# Patient Record
Sex: Male | Born: 1990 | Race: Black or African American | Hispanic: No | Marital: Single | Smoking: Never smoker
Health system: Southern US, Community
[De-identification: ages and names within clinical notes are randomized; demographics above are authoritative.]

## PROBLEM LIST (undated history)

## (undated) DIAGNOSIS — B029 Zoster without complications: Secondary | ICD-10-CM

---

## 2008-09-11 ENCOUNTER — Ambulatory Visit: Payer: Self-pay | Admitting: Diagnostic Radiology

## 2008-09-11 ENCOUNTER — Emergency Department (HOSPITAL_BASED_OUTPATIENT_CLINIC_OR_DEPARTMENT_OTHER): Admission: EM | Admit: 2008-09-11 | Discharge: 2008-09-11 | Payer: Self-pay | Admitting: Emergency Medicine

## 2013-09-17 ENCOUNTER — Encounter (HOSPITAL_BASED_OUTPATIENT_CLINIC_OR_DEPARTMENT_OTHER): Payer: Self-pay | Admitting: Emergency Medicine

## 2013-09-17 ENCOUNTER — Emergency Department (HOSPITAL_BASED_OUTPATIENT_CLINIC_OR_DEPARTMENT_OTHER): Payer: Self-pay

## 2013-09-17 ENCOUNTER — Emergency Department (HOSPITAL_BASED_OUTPATIENT_CLINIC_OR_DEPARTMENT_OTHER)
Admission: EM | Admit: 2013-09-17 | Discharge: 2013-09-17 | Disposition: A | Discharge status: Home / Self Care | Payer: Self-pay | Attending: Emergency Medicine | Admitting: Emergency Medicine

## 2013-09-17 DIAGNOSIS — S62306A Unspecified fracture of fifth metacarpal bone, right hand, initial encounter for closed fracture: Secondary | ICD-10-CM

## 2013-09-17 DIAGNOSIS — Y929 Unspecified place or not applicable: Secondary | ICD-10-CM | POA: Insufficient documentation

## 2013-09-17 DIAGNOSIS — Z79899 Other long term (current) drug therapy: Secondary | ICD-10-CM | POA: Insufficient documentation

## 2013-09-17 DIAGNOSIS — IMO0002 Reserved for concepts with insufficient information to code with codable children: Secondary | ICD-10-CM | POA: Insufficient documentation

## 2013-09-17 DIAGNOSIS — Y9389 Activity, other specified: Secondary | ICD-10-CM | POA: Insufficient documentation

## 2013-09-17 DIAGNOSIS — S62309A Unspecified fracture of unspecified metacarpal bone, initial encounter for closed fracture: Secondary | ICD-10-CM | POA: Insufficient documentation

## 2013-09-17 MED ORDER — HYDROCODONE-ACETAMINOPHEN 5-325 MG PO TABS: 1.0000 | ORAL_TABLET | ORAL | Status: DC | PRN
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
## 2013-09-17 NOTE — Discharge Instructions (Signed)
Take Vicodin for severe pain only. No driving or operating heavy machinery while taking vicodin. This medication may cause drowsiness.  Boxer's Fracture You have a break (fracture) of the fifth metacarpal bone. This is commonly called a boxer's fracture. This is the bone in the hand where the little finger attaches. The fracture is in the end of that bone, closest to the little finger. It is usually caused when you hit an object with a clenched fist. Often, the knuckle is pushed down by the impact. Sometimes, the fracture rotates out of position. A boxer's fracture will usually heal within 6 weeks, if it is treated properly and protected from re-injury. Surgery is sometimes needed. A cast, splint, or bulky hand dressing may be used to protect and immobilize a boxer's fracture. Do not remove this device or dressing until your caregiver approves. Keep your hand elevated, and apply ice packs for 15-20 minutes every 2 hours, for the first 2 days. Elevation and ice help reduce swelling and relieve pain. See your caregiver, or an orthopedic specialist, for follow-up care within the next 10 days. This is to make sure your fracture is healing properly. Document Released: 04/12/2005 Document Revised: 07/05/2011 Document Reviewed: 09/30/2006 [facility] Patient Information 2014 [place], [place].
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
## 2013-09-21 NOTE — ED Provider Notes (Signed)
Medical screening examination/treatment/procedure(s) were performed by non-physician practitioner and as supervising physician I was immediately available for consultation/collaboration.   EKG Interpretation None        Jaydrien Wassenaar T Demetric Dunnaway, MD 09/21/13 1552 

## 2013-09-23 ENCOUNTER — Encounter (HOSPITAL_BASED_OUTPATIENT_CLINIC_OR_DEPARTMENT_OTHER): Payer: Self-pay | Admitting: Emergency Medicine

## 2013-09-23 ENCOUNTER — Emergency Department (HOSPITAL_BASED_OUTPATIENT_CLINIC_OR_DEPARTMENT_OTHER)
Admission: EM | Admit: 2013-09-23 | Discharge: 2013-09-23 | Disposition: A | Discharge status: Home / Self Care | Payer: Self-pay | Attending: Emergency Medicine | Admitting: Emergency Medicine

## 2013-09-23 DIAGNOSIS — IMO0001 Reserved for inherently not codable concepts without codable children: Secondary | ICD-10-CM | POA: Insufficient documentation

## 2013-09-23 DIAGNOSIS — S62309A Unspecified fracture of unspecified metacarpal bone, initial encounter for closed fracture: Secondary | ICD-10-CM

## 2013-09-23 NOTE — Discharge Instructions (Signed)
Cast or Splint Care Casts and splints support injured limbs and keep bones from moving while they heal.  HOME CARE  Keep the cast or splint uncovered during the drying period.  A plaster cast can take 24 to 48 hours to dry.  A fiberglass cast will dry in less than 1 hour.  Do not rest the cast on anything harder than a pillow for 24 hours.  Do not put weight on your injured limb. Do not put pressure on the cast. Wait for your doctor's approval.  Keep the cast or splint dry.  Cover the cast or splint with a plastic bag during baths or wet weather.  If you have a cast over your chest and belly (trunk), take sponge baths until the cast is taken off.  If your cast gets wet, dry it with a towel or blow dryer. Use the cool setting on the blow dryer.  Keep your cast or splint clean. Wash a dirty cast with a damp cloth.  Do not put any objects under your cast or splint.  Do not scratch the skin under the cast with an object. If itching is a problem, use a blow dryer on a cool setting over the itchy area.  Do not trim or cut your cast.  Do not take out the padding from inside your cast.  Exercise your joints near the cast as told by your doctor.  Raise (elevate) your injured limb on 1 or 2 pillows for the first 1 to 3 days. GET HELP IF:  Your cast or splint cracks.  Your cast or splint is too tight or too loose.  You itch badly under the cast.  Your cast gets wet or has a soft spot.  You have a bad smell coming from the cast.  You get an object stuck under the cast.  Your skin around the cast becomes red or sore.  You have new or more pain after the cast is put on. GET HELP RIGHT AWAY IF:  You have fluid leaking through the cast.  You cannot move your fingers or toes.  Your fingers or toes turn blue or white or are cool, painful, or puffy (swollen).  You have tingling or lose feeling (numbness) around the injured area.  You have bad pain or pressure under the  cast.  You have trouble breathing or have shortness of breath.  You have chest pain. Document Released: 08/12/2010 Document Revised: 12/13/2012 Document Reviewed: 10/19/2012 [facility] Patient Information 2014 [place]. Metacarpal Fracture   The metacarpal bones are in the middle of the hand, connecting the fingers to the wrist. A metacarpal fracture is a break in one of these bones. It is common for an injury of the hand to break one or more of these bones. A metacarpal fracture of the fifth (little) finger, near the knuckle, is also known as a boxer's fracture. SYMPTOMS   Severe pain at the time of injury.  Pain, tenderness, swelling (especially the back of the hand).  Bruising of the hand within 48 hours.  Visible deformity, if the fracture out of alignment (displaced).  Numbness or paralysis from swelling in the hand, causing pressure on the blood vessels or nerves (uncommon). CAUSES   Direct hit (trauma) to the hand, such as a striking blow with the fist.  Indirect stress to the hand, such as twisting or violent muscle contraction (uncommon). RISK INCREASES WITH:  Contact sports (football, rugby, soccer).  Sports that require hitting (boxing, martial arts).  History of bone or joint disease, including osteoporosis.  Poor hand strength and flexibility. PREVENTION  Maintain proper conditioning:  Hand and finger strength.  Flexibility and endurance.  For contact sports, wear properly fitted and padded protective equipment for the hand.  Learn and use proper technique when hitting, punching, and landing from a fall. PROGNOSIS If treated properly, metacarpal fractures can be expected to heal within 4 to 6 weeks. For severe injuries, surgery may be needed. RELATED COMPLICATIONS   Fracture does not heal (nonunion).  Heals in a poor position, including twisted fingers (malunion).  Chronic pain, stiffness, or swelling of the hand.  Excessive bleeding in the  hand, causing pressure and injury to nerves and blood vessels (rare).  Unstable or arthritic joint, following repeated injury or delayed treatment.  Hindrance of normal hand growth in children.  Infection in open fractures (skin broken over fracture) or at the incision or pin sites, if surgery was performed.  Shortening or injured bones.  Bony bump (spur) or loss of shape of the knuckles. TREATMENT  Treatment will vary, depending on the extent of the injury. First, ice and medicine will help reduce pain and inflammation. For a single metacarpal fracture that is not displaced and does not involve the joint, restraint is usually sufficient for healing to occur. Multiple metacarpal fractures, fractures that are displaced, or fractures involving the joint may require surgery. Surgery often involves placing pins and screws in the bones, to hold them in place. Restraint of the injury follows surgery, to allow for healing. After restraint (with or without surgery), stretching and strengthening exercises may be needed to regain strength and a full range of motion. Exercises may be done at home or with a therapist. Sometimes, depending on the sport and position, a brace or splint may be needed when first returning to sports. MEDICATION   Do not take pain medicine for 7 days before surgery.  Only take over-the-counter or prescription medicines for pain, fever, or discomfort as directed by your caregiver.  Prescription pain medicines are usually prescribed only after surgery. Use only as directed and only as much as you need. COLD THERAPY  Cold treatment (icing) should be applied for 10 to 15 minutes every 2 to 3 hours for inflammation and pain, and immediately after activity that aggravates your symptoms. Use ice packs or an ice massage. SEEK IMMEDIATE MEDICAL CARE IF:   Pain, tenderness, or swelling gets worse even with treatment.  You have pain, numbness, or coldness in the hand.  Blue, gray, or  dark color appears in the fingernails.  Any of the following occur after surgery:  You have an oral temperature above 102 F (38.9 C), not controlled by medicine.  You have increased pain, swelling, redness, drainage of fluids, or bleeding in the affected area.  New, unexplained symptoms develop. (Drugs used in treatment may produce side effects.) Document Released: 04/26/1998 Document Revised: 07/05/2011 Document Reviewed: 07/25/2008 [facility] Patient Information 2014 [place], [place].

## 2013-09-23 NOTE — ED Notes (Signed)
Pt reports broke fifth digit  On right hand seen Monday past at [facility] cast inplace  Pt currently reports having increased hand pain and medication ineffective for relief. Also states needs new referral for orthopedic doctor d/t cost .

## 2013-09-23 NOTE — ED Notes (Signed)
I removed patient splint per PA request, Patient used alcohol wipes to clean skin, itch, etc. PA Sophia approved new splint of same type. I applied an ulna gutter splint using sock material, cotton webril, fiberglass with kerlix, then ace wrap. I secured same with tape and placed patient's arm sling he had with him on arrival.

## 2013-09-24 NOTE — ED Provider Notes (Signed)
Medical screening examination/treatment/procedure(s) were performed by non-physician practitioner and as supervising physician I was immediately available for consultation/collaboration.   EKG Interpretation None       Glynn Octave, MD 09/24/13 [phone]

## 2013-09-24 NOTE — ED Provider Notes (Signed)
CSN: [id number]     Arrival date & time 09/23/13  2103 History   First MD Initiated Contact with Patient 09/23/13 2157     Chief Complaint  Patient presents with  . Hand Pain     (Consider location/radiation/quality/duration/timing/severity/associated sxs/prior Treatment) Patient is a 23 y.o. male presenting with hand pain. The history is provided by the patient. No language interpreter was used.  Hand Pain This is a new problem. The current episode started today. The problem occurs constantly. The problem has been gradually worsening. Associated symptoms include joint swelling and myalgias. Nothing aggravates the symptoms. He has tried nothing for the symptoms. The treatment provided moderate relief.  Pt complains of   History reviewed. No pertinent past medical history. History reviewed. No pertinent past surgical history. History reviewed. No pertinent family history. History  Substance Use Topics  . Smoking status: Never Smoker   . Smokeless tobacco: Not on file  . Alcohol Use: No    Review of Systems  Musculoskeletal: Positive for joint swelling and myalgias.  All other systems reviewed and are negative.     Allergies  Review of patient's allergies indicates no known allergies.  Home Medications   Prior to Admission medications   Medication Sig Start Date End Date Taking? Authorizing Provider  HYDROcodone-acetaminophen (NORCO/VICODIN) 5-325 MG per tablet Take 1-2 tablets by mouth every 4 (four) hours as needed. 09/17/13   Trevor Mace, PA-C   BP 130/83  Pulse 53  Temp(Src) 98.1 F (36.7 C) (Oral)  Resp 20  Ht 5\' 8"  (1.727 m)  Wt 155 lb (70.308 kg)  BMI 23.57 kg/m2  SpO2 100% Physical Exam  Nursing note and vitals reviewed. Constitutional: He appears well-developed and well-nourished.  HENT:  Head: Normocephalic.  Left Ear: External ear normal.  Eyes: Pupils are equal, round, and reactive to light.  Cardiovascular: Normal rate.   Pulmonary/Chest: Effort  normal.  Musculoskeletal: He exhibits tenderness.  Splint removed,  Good pulses,    Neurological: He is alert.  Skin: Skin is warm.  Psychiatric: He has a normal mood and affect.   P ED Course  Procedures (including critical care time) Labs Review Labs Reviewed - No data to display  Imaging Review No results found.   EKG Interpretation None      MDM Pt advised to see Dr, Pearletha Forge for evaluation   Final diagnoses:  Fracture, metacarpal    Splint, ibuprofen    Elson Areas, PA-C 09/24/13 0014

## 2013-09-26 ENCOUNTER — Ambulatory Visit (INDEPENDENT_AMBULATORY_CARE_PROVIDER_SITE_OTHER): Payer: Self-pay | Admitting: Family Medicine

## 2013-09-26 ENCOUNTER — Encounter: Payer: Self-pay | Admitting: Family Medicine

## 2013-09-26 VITALS — BP 134/90 | HR 52 | Ht 68.0 in | Wt 155.0 lb

## 2013-09-26 DIAGNOSIS — S62339A Displaced fracture of neck of unspecified metacarpal bone, initial encounter for closed fracture: Secondary | ICD-10-CM

## 2013-09-26 DIAGNOSIS — S62309A Unspecified fracture of unspecified metacarpal bone, initial encounter for closed fracture: Secondary | ICD-10-CM

## 2013-09-26 MED ORDER — HYDROCODONE-ACETAMINOPHEN 5-325 MG PO TABS: 1.0000 | ORAL_TABLET | Freq: Four times a day (QID) | ORAL | Status: DC | PRN

## 2013-09-26 NOTE — Patient Instructions (Signed)
You have a boxer's fracture of the 5th metacarpal. Wear gutter splint at all times - generally for a full 6 weeks. Follow up with me in 2-3 weeks for reevaluation. Norco as needed for severe pain. Elevate as much as possible as well. No use of this hand at work - must be out of work if nothing available.

## 2013-09-28 ENCOUNTER — Encounter: Payer: Self-pay | Admitting: Family Medicine

## 2013-09-28 DIAGNOSIS — S62339A Displaced fracture of neck of unspecified metacarpal bone, initial encounter for closed fracture: Secondary | ICD-10-CM | POA: Insufficient documentation

## 2013-09-28 NOTE — Assessment & Plan Note (Signed)
placed in ulnar gutter splint.  Anticipate total of 6 weeks to wear regularly.  F/u in 2-3 weeks.  Plan to repeat radiographs at that time.  Norco as needed for pain.  Elevation.  Work note given to not use this hand.
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
## 2013-10-17 ENCOUNTER — Ambulatory Visit: Payer: Self-pay | Admitting: Family Medicine

## 2014-07-03 ENCOUNTER — Encounter (HOSPITAL_BASED_OUTPATIENT_CLINIC_OR_DEPARTMENT_OTHER): Payer: Self-pay | Admitting: *Deleted

## 2014-07-03 ENCOUNTER — Emergency Department (HOSPITAL_BASED_OUTPATIENT_CLINIC_OR_DEPARTMENT_OTHER)
Admission: EM | Admit: 2014-07-03 | Discharge: 2014-07-03 | Disposition: A | Discharge status: Home / Self Care | Payer: Self-pay | Attending: Emergency Medicine | Admitting: Emergency Medicine

## 2014-07-03 DIAGNOSIS — G44019 Episodic cluster headache, not intractable: Secondary | ICD-10-CM | POA: Insufficient documentation

## 2014-07-03 MED ORDER — METOCLOPRAMIDE HCL 10 MG PO TABS: 10.0000 mg | ORAL_TABLET | Freq: Four times a day (QID) | ORAL | Status: DC | PRN

## 2014-07-03 MED ORDER — HYDROCODONE-ACETAMINOPHEN 5-325 MG PO TABS: 1.0000 | ORAL_TABLET | Freq: Four times a day (QID) | ORAL | Status: DC | PRN

## 2014-07-03 NOTE — ED Provider Notes (Signed)
CSN: [id number]     Arrival date & time 07/03/14  0546 History   First MD Initiated Contact with Patient 07/03/14 0600     Chief Complaint  Patient presents with  . Headache     (Consider location/radiation/quality/duration/timing/severity/associated sxs/prior Treatment) HPI  This is a 24 year old male with a four-day history of headaches. The headaches are intermittent and last about 15 minutes at a time. They occur several times a day. There is no specific trigger. He has taken ibuprofen without relief area when they occur the pain is moderate to severe. The pain is localized around the left eye. There is associated blurriness of the left eye and photophobia when the headaches are present. He denies nausea, vomiting or any neurologic changes. He has no history of similar headaches in the past.  History reviewed. No pertinent past medical history. History reviewed. No pertinent past surgical history. No family history on file. History  Substance Use Topics  . Smoking status: Never Smoker   . Smokeless tobacco: Not on file  . Alcohol Use: No    Review of Systems  All other systems reviewed and are negative.   Allergies  Review of patient's allergies indicates no known allergies.  Home Medications   Prior to Admission medications   Medication Sig Start Date End Date Taking? Authorizing Provider  HYDROcodone-acetaminophen (NORCO/VICODIN) 5-325 MG per tablet Take 1 tablet by mouth every 6 (six) hours as needed. 09/26/13   Lenda KelpShane R Hudnall, MD   BP 126/83 mmHg  Pulse 60  Temp(Src) 97.9 F (36.6 C) (Oral)  Resp 16  Ht 5\' 8"  (1.727 m)  Wt 155 lb (70.308 kg)  BMI 23.57 kg/m2  SpO2 99%   Physical Exam  General: Well-developed, well-nourished male in no acute distress; appearance consistent with age of record HENT: normocephalic; atraumatic Eyes: pupils equal, round and reactive to light; extraocular muscles intact Neck: supple Heart: regular rate and rhythm Lungs: clear to  auscultation bilaterally Abdomen: soft; nondistended; nontender; no masses or hepatosplenomegaly; bowel sounds present Extremities: No deformity; full range of motion; pulses normal Neurologic: Awake, alert and oriented; motor function intact in all extremities and symmetric; no facial droop; normal coordination speech; negative Romberg; normal finger to nose Skin: Warm and dry Psychiatric: Normal mood and affect    ED Course  Procedures (including critical care time)   MDM  6:36 AM Headache resolved after treatment with 100% oxygen by nonrebreather.   Paula LibraJohn Laird Runnion, MD 07/03/14 [phone]

## 2014-07-03 NOTE — ED Notes (Signed)
C/o ha off and on since Saturday  Denies n/v,  Left eye blurry at times onset last pm

## 2014-07-03 NOTE — ED Notes (Signed)
Ha onset Saturday evening, denies n/v, denies inj, blurred vision in left eye off and on onset last pm

## 2014-07-03 NOTE — Discharge Instructions (Signed)
Cluster Headache Cluster headaches are recognized by their pattern of deep, intense head pain. They normally occur on one side of your head, but they may "switch sides" in subsequent episodes. Typically, cluster headaches:   Are severe in nature.   Occur repeatedly over weeks to months and are followed by periods of no headaches.   Can last from 15 minutes to 3 hours.   Occur at the same time each day, often at night.   Occur several times a day. CAUSES The exact cause of cluster headaches is not known. Alcohol use may be associated with cluster headaches. SIGNS AND SYMPTOMS   Severe pain that begins in or around your eye or temple.   One-sided head pain.   Feeling sick to your stomach (nauseous).   Sensitivity to light.   Runny nose.   Eye redness, tearing, and nasal stuffiness on the side of your head where you are experiencing pain.   Sweaty, pale skin of the face.   Droopy or swollen eyelid.   Restlessness. DIAGNOSIS  Cluster headaches are diagnosed based on symptoms and a physical exam. Your health care provider may order a CT scan or an MRI of your head or lab tests to see if your headaches are caused by other medical conditions.  TREATMENT   Medicines for pain relief and to prevent recurrent attacks. Some people may need a combination of medicines.  Oxygen for pain relief.   Biofeedback programs to help reduce headache pain.  It may be helpful to keep a headache diary. This may help you find a trend for what is triggering your headaches. Your health care provider can develop a treatment plan.  HOME CARE INSTRUCTIONS  During cluster periods:   Follow a regular sleep schedule. Do not vary the amount and time that you sleep from day to day. It is important to stay on the same schedule during a cluster period to help prevent headaches.   Avoid alcohol.   Stop smoking if you smoke.  SEEK MEDICAL CARE IF:  You have any changes from your previous  cluster headaches either in intensity or frequency.   You are not getting relief from medicines you are taking.  SEEK IMMEDIATE MEDICAL CARE IF:   You faint.   You have weakness or numbness, especially on one side of your body or face.   You have double vision.   You have nausea or vomiting that is not relieved within several hours.   You cannot keep your balance or have difficulty talking or walking.   You have neck pain or stiffness.   You have a fever. MAKE SURE YOU:  Understand these instructions.   Will watch your condition.   Will get help right away if you are not doing well or get worse. Document Released: 04/12/2005 Document Revised: 01/31/2013 Document Reviewed: 11/02/2012 [employer] Patient Information 2015 [employer]. This information is not intended to replace advice given to you by your health care provider. Make sure you discuss any questions you have with your health care provider.  

## 2014-07-05 ENCOUNTER — Encounter (HOSPITAL_BASED_OUTPATIENT_CLINIC_OR_DEPARTMENT_OTHER): Payer: Self-pay | Admitting: *Deleted

## 2014-07-05 ENCOUNTER — Emergency Department (HOSPITAL_BASED_OUTPATIENT_CLINIC_OR_DEPARTMENT_OTHER)
Admission: EM | Admit: 2014-07-05 | Discharge: 2014-07-05 | Disposition: A | Discharge status: Home / Self Care | Payer: Self-pay | Attending: Emergency Medicine | Admitting: Emergency Medicine

## 2014-07-05 DIAGNOSIS — B029 Zoster without complications: Secondary | ICD-10-CM | POA: Insufficient documentation

## 2014-07-05 MED ORDER — FLUORESCEIN SODIUM 1 MG OP STRP
2.0000 | ORAL_STRIP | Freq: Once | OPHTHALMIC | Status: AC
  Administered 2014-07-05: 2 via OPHTHALMIC

## 2014-07-05 MED ORDER — TETRACAINE HCL 0.5 % OP SOLN
2.0000 [drp] | Freq: Once | OPHTHALMIC | Status: AC
  Administered 2014-07-05: 2 [drp] via OPHTHALMIC

## 2014-07-05 MED ORDER — ACYCLOVIR 400 MG PO TABS: 400.0000 mg | ORAL_TABLET | Freq: Four times a day (QID) | ORAL | Status: DC

## 2014-07-05 MED ORDER — TETRACAINE HCL 0.5 % OP SOLN
OPHTHALMIC | Status: AC
Start: 2014-07-05 — End: 2014-07-05
  Filled 2014-07-05: qty 2

## 2014-07-05 MED ORDER — OXYCODONE-ACETAMINOPHEN 5-325 MG PO TABS: 1.0000 | ORAL_TABLET | ORAL | Status: DC | PRN

## 2014-07-05 MED ORDER — FLUORESCEIN SODIUM 1 MG OP STRP
ORAL_STRIP | OPHTHALMIC | Status: AC
  Filled 2014-07-05: qty 2

## 2014-07-05 NOTE — ED Provider Notes (Signed)
CSN: [id number]     Arrival date & time 07/05/14  [id number] History   First MD Initiated Contact with Patient 07/05/14 1013     Chief Complaint  Patient presents with  . Rash     (Consider location/radiation/quality/duration/timing/severity/associated sxs/prior Treatment) HPI  Joshua Joseph 24-year-old male comes today stating he has had a left-sided headache for approximately one week. 2 days ago he began having a rash in the left forehead area. The rash is also on his nose. He had any fever. He continues to have pain. He was seen here 2 days ago for similar symptoms. He did not have a rash at that time. Some pain associated with the left ear. He has no change in hearing. He has no eye pain or visual changes.  History reviewed. No pertinent past medical history. History reviewed. No pertinent past surgical history. No family history on file. History  Substance Use Topics  . Smoking status: Never Smoker   . Smokeless tobacco: Not on file  . Alcohol Use: No    Review of Systems  All other systems reviewed and are negative.     Allergies  Review of patient's allergies indicates no known allergies.  Home Medications   Prior to Admission medications   Medication Sig Start Date End Date Taking? Authorizing Provider  HYDROcodone-acetaminophen (NORCO/VICODIN) 5-325 MG per tablet Take 1-2 tablets by mouth every 6 (six) hours as needed (for pain). 07/03/14   John Molpus, MD  metoCLOPramide (REGLAN) 10 MG tablet Take 1 tablet (10 mg total) by mouth every 6 (six) hours as needed (for nausea or headache). 07/03/14   John Molpus, MD   BP 120/75 mmHg  Pulse 60  Temp(Src) 98.6 F (37 C) (Oral)  Resp 16  Ht 5\' 8"  (1.727 m)  Wt 155 lb (70.308 kg)  BMI 23.57 kg/m2  SpO2 98% Physical Exam  Constitutional: He is oriented to person, place, and time. He appears well-developed and well-nourished.  HENT:  Head: Atraumatic.    Right Ear: External ear normal.  Left TM with some serous charge behind it. No  lesions are noted of the ear canal or tympanic membrane.  Vesicular rash on left forehead does not cross midline  Eyes: Conjunctivae are normal. Pupils are equal, round, and reactive to light.  Tetracaine and forcing applied and cornea examined with no evidence of lesions noted.  Neck: Normal range of motion. Neck supple.  Cardiovascular: Normal rate, regular rhythm and normal heart sounds.   Pulmonary/Chest: Effort normal.  Abdominal: Soft. Bowel sounds are normal.  Musculoskeletal: Normal range of motion.  Neurological: He is alert and oriented to person, place, and time.  Skin: Skin is dry. Rash noted.  Psychiatric: He has a normal mood and affect. His behavior is normal. Judgment and thought content normal.  Nursing note and vitals reviewed.   ED Course  Procedures (including critical care time) Labs Review Labs Reviewed - No data to display  Imaging Review No results found.   EKG Interpretation None      MDM   Final diagnoses:  Herpes zoster    24 y.o. male presents with herpes zoster left facial distribution. No eye or ear involvement is noted on exam. Patient is placed on acyclovir. I have discussed return precautions with the patient including eye pain and need for follow-up. Patient voices understanding.    Margarita Grizzleanielle Zenaya Ulatowski, MD 07/05/14 1131

## 2014-07-05 NOTE — Discharge Instructions (Signed)

## 2014-07-05 NOTE — ED Notes (Addendum)
Pt c/o headache x1 week. Was seen here 2 days ago but HA intermittently persists. Pt more concerned with c/o new rash on forehead.

## 2014-07-08 ENCOUNTER — Encounter (HOSPITAL_BASED_OUTPATIENT_CLINIC_OR_DEPARTMENT_OTHER): Payer: Self-pay

## 2014-07-08 ENCOUNTER — Emergency Department (HOSPITAL_BASED_OUTPATIENT_CLINIC_OR_DEPARTMENT_OTHER)
Admission: EM | Admit: 2014-07-08 | Discharge: 2014-07-08 | Disposition: A | Discharge status: Home / Self Care | Payer: Self-pay | Attending: Emergency Medicine | Admitting: Emergency Medicine

## 2014-07-08 DIAGNOSIS — B029 Zoster without complications: Secondary | ICD-10-CM | POA: Insufficient documentation

## 2014-07-08 DIAGNOSIS — K2970 Gastritis, unspecified, without bleeding: Secondary | ICD-10-CM | POA: Insufficient documentation

## 2014-07-08 DIAGNOSIS — Z79899 Other long term (current) drug therapy: Secondary | ICD-10-CM | POA: Insufficient documentation

## 2014-07-08 DIAGNOSIS — R51 Headache: Secondary | ICD-10-CM | POA: Insufficient documentation

## 2014-07-08 HISTORY — DX: Zoster without complications: B02.9

## 2014-07-08 MED ORDER — PANTOPRAZOLE SODIUM 40 MG PO TBEC
40.0000 mg | DELAYED_RELEASE_TABLET | Freq: Once | ORAL | Status: AC
  Administered 2014-07-08: 40 mg via ORAL
  Filled 2014-07-08: qty 1

## 2014-07-08 MED ORDER — HYDROCODONE-ACETAMINOPHEN 5-325 MG PO TABS
2.0000 | ORAL_TABLET | Freq: Four times a day (QID) | ORAL | Status: DC | PRN
Start: 2014-07-08 — End: 2015-05-02

## 2014-07-08 MED ORDER — GI COCKTAIL ~~LOC~~
30.0000 mL | Freq: Once | ORAL | Status: AC
  Administered 2014-07-08: 30 mL via ORAL
  Filled 2014-07-08: qty 30

## 2014-07-08 MED ORDER — OMEPRAZOLE 20 MG PO CPDR: 20.0000 mg | DELAYED_RELEASE_CAPSULE | Freq: Two times a day (BID) | ORAL | Status: DC

## 2014-07-08 NOTE — Discharge Instructions (Signed)
Stop ibuprofen Take acyclovir as directed, take it with food. Her pain take either Tylenol, or the prescription Vicodin, but not both. Vital a second twice a day until your stomach improves, then once per day until you've completed all medications.  Gastritis, Adult Gastritis is soreness and swelling (inflammation) of the lining of the stomach. Gastritis can develop as a sudden onset (acute) or long-term (chronic) condition. If gastritis is not treated, it can lead to stomach bleeding and ulcers. CAUSES  Gastritis occurs when the stomach lining is weak or damaged. Digestive juices from the stomach then inflame the weakened stomach lining. The stomach lining may be weak or damaged due to viral or bacterial infections. One common bacterial infection is the Helicobacter pylori infection. Gastritis can also result from excessive alcohol consumption, taking certain medicines, or having too much acid in the stomach.  SYMPTOMS  In some cases, there are no symptoms. When symptoms are present, they may include:  Pain or a burning sensation in the upper abdomen.  Nausea.  Vomiting.  An uncomfortable feeling of fullness after eating. DIAGNOSIS  Your caregiver may suspect you have gastritis based on your symptoms and a physical exam. To determine the cause of your gastritis, your caregiver may perform the following:  Blood or stool tests to check for the H pylori bacterium.  Gastroscopy. A thin, flexible tube (endoscope) is passed down the esophagus and into the stomach. The endoscope has a light and camera on the end. Your caregiver uses the endoscope to view the inside of the stomach.  Taking a tissue sample (biopsy) from the stomach to examine under a microscope. TREATMENT  Depending on the cause of your gastritis, medicines may be prescribed. If you have a bacterial infection, such as an H pylori infection, antibiotics may be given. If your gastritis is caused by too much acid in the stomach, H2  blockers or antacids may be given. Your caregiver may recommend that you stop taking aspirin, ibuprofen, or other nonsteroidal anti-inflammatory drugs (NSAIDs). HOME CARE INSTRUCTIONS  Only take over-the-counter or prescription medicines as directed by your caregiver.  If you were given antibiotic medicines, take them as directed. Finish them even if you start to feel better.  Drink enough fluids to keep your urine clear or pale yellow.  Avoid foods and drinks that make your symptoms worse, such as:  Caffeine or alcoholic drinks.  Chocolate.  Peppermint or mint flavorings.  Garlic and onions.  Spicy foods.  Citrus fruits, such as oranges, lemons, or limes.  Tomato-based foods such as sauce, chili, salsa, and pizza.  Fried and fatty foods.  Eat small, frequent meals instead of large meals. SEEK IMMEDIATE MEDICAL CARE IF:   You have black or dark red stools.  You vomit blood or material that looks like coffee grounds.  You are unable to keep fluids down.  Your abdominal pain gets worse.  You have a fever.  You do not feel better after 1 week.  You have any other questions or concerns. MAKE SURE YOU:  Understand these instructions.  Will watch your condition.  Will get help right away if you are not doing well or get worse. Document Released: 04/06/2001 Document Revised: 10/12/2011 Document Reviewed: 05/26/2011 [facility] Patient Information 2015 [place], [place]. This information is not intended to replace advice given to you by your health care provider. Make sure you discuss any questions you have with your health care provider.  Shingles Shingles (herpes zoster) is an infection that is caused by the  same virus that causes chickenpox (varicella). The infection causes a painful skin rash and fluid-filled blisters, which eventually break open, crust over, and heal. It may occur in any area of the body, but it usually affects only one side of the body or face. The pain  of shingles usually lasts about 1 month. However, some people with shingles may develop long-term (chronic) pain in the affected area of the body. Shingles often occurs many years after the person had chickenpox. It is more common:  In people older than 50 years.  In people with weakened immune systems, such as those with HIV, AIDS, or cancer.  In people taking medicines that weaken the immune system, such as transplant medicines.  In people under great stress. CAUSES  Shingles is caused by the varicella zoster virus (VZV), which also causes chickenpox. After a person is infected with the virus, it can remain in the person's body for years in an inactive state (dormant). To cause shingles, the virus reactivates and breaks out as an infection in a nerve root. The virus can be spread from person to person (contagious) through contact with open blisters of the shingles rash. It will only spread to people who have not had chickenpox. When these people are exposed to the virus, they may develop chickenpox. They will not develop shingles. Once the blisters scab over, the person is no longer contagious and cannot spread the virus to others. SIGNS AND SYMPTOMS  Shingles shows up in stages. The initial symptoms may be pain, itching, and tingling in an area of the skin. This pain is usually described as burning, stabbing, or throbbing.In a few days or weeks, a painful red rash will appear in the area where the pain, itching, and tingling were felt. The rash is usually on one side of the body in a band or belt-like pattern. Then, the rash usually turns into fluid-filled blisters. They will scab over and dry up in approximately 2-3 weeks. Flu-like symptoms may also occur with the initial symptoms, the rash, or the blisters. These may include:  Fever.  Chills.  Headache.  Upset stomach. DIAGNOSIS  Your health care provider will perform a skin exam to diagnose shingles. Skin scrapings or fluid samples  may also be taken from the blisters. This sample will be examined under a microscope or sent to a lab for further testing. TREATMENT  There is no specific cure for shingles. Your health care provider will likely prescribe medicines to help you manage the pain, recover faster, and avoid long-term problems. This may include antiviral drugs, anti-inflammatory drugs, and pain medicines. HOME CARE INSTRUCTIONS   Take a cool bath or apply cool compresses to the area of the rash or blisters as directed. This may help with the pain and itching.   Take medicines only as directed by your health care provider.   Rest as directed by your health care provider.  Keep your rash and blisters clean with mild soap and cool water or as directed by your health care provider.  Do not pick your blisters or scratch your rash. Apply an anti-itch cream or numbing creams to the affected area as directed by your health care provider.  Keep your shingles rash covered with a loose bandage (dressing).  Avoid skin contact with:  Babies.   Pregnant women.   Children with eczema.   Elderly people with transplants.   People with chronic illnesses, such as leukemia or AIDS.   Wear loose-fitting clothing to help ease the  pain of material rubbing against the rash.  Keep all follow-up visits as directed by your health care provider.If the area involved is on your face, you may receive a referral for a specialist, such as an eye doctor (ophthalmologist) or an ear, nose, and throat (ENT) doctor. Keeping all follow-up visits will help you avoid eye problems, chronic pain, or disability.  SEEK IMMEDIATE MEDICAL CARE IF:   You have facial pain, pain around the eye area, or loss of feeling on one side of your face.  You have ear pain or ringing in your ear.  You have loss of taste.  Your pain is not relieved with prescribed medicines.   Your redness or swelling spreads.   You have more pain and  swelling.  Your condition is worsening or has changed.   You have a fever. MAKE SURE YOU:  Understand these instructions.  Will watch your condition.  Will get help right away if you are not doing well or get worse. Document Released: 04/12/2005 Document Revised: 08/27/2013 Document Reviewed: 11/25/2011 [facility] Patient Information 2015 [place], [place]. This information is not intended to replace advice given to you by your health care provider. Make sure you discuss any questions you have with your health care provider.

## 2014-07-08 NOTE — ED Provider Notes (Signed)
CSN: [id number]     Arrival date & time 07/08/14  0751 History   First MD Initiated Contact with Patient 07/08/14 0758     No chief complaint on file.     HPI  Patient presents for evaluation of abdominal pain. Seen twice several days ago. Had a left-sided headache. Seen 2 days later as he developed a classic herpes zoster rash. Has lesions on his nose. Had normal staining of his eye. Discharged on acyclovir. Has been taking ibuprofen 2-3 capsules every 4 hours when he takes his Vicodin. Has developed "upset stomach" states she has epigastric discomfort in his abdomen. Seems sometimes better sometimes worse with taking by mouth. No nausea vomiting or diarrhea. No black or bloody stools. No other symptoms. No history of abdominal syndromes. No history of biliary colic, hepatitis, pancreatitis. No history of peptic ulcer disease or gastritis.  Past Medical History  Diagnosis Date  . Shingles    History reviewed. No pertinent past surgical history. History reviewed. No pertinent family history. History  Substance Use Topics  . Smoking status: Never Smoker   . Smokeless tobacco: Not on file  . Alcohol Use: No    Review of Systems  Constitutional: Negative for fever, chills, diaphoresis, appetite change and fatigue.  HENT: Negative for mouth sores, sore throat and trouble swallowing.   Eyes: Negative for visual disturbance.  Respiratory: Negative for cough, chest tightness, shortness of breath and wheezing.   Cardiovascular: Negative for chest pain.  Gastrointestinal: Positive for abdominal pain. Negative for nausea, vomiting, diarrhea and abdominal distention.  Endocrine: Negative for polydipsia, polyphagia and polyuria.  Genitourinary: Negative for dysuria, frequency and hematuria.  Musculoskeletal: Negative for gait problem.  Skin: Positive for rash. Negative for color change and pallor.  Neurological: Positive for headaches. Negative for dizziness, syncope and light-headedness.    Hematological: Does not bruise/bleed easily.  Psychiatric/Behavioral: Negative for behavioral problems and confusion.      Allergies  Review of patient's allergies indicates no known allergies.  Home Medications   Prior to Admission medications   Medication Sig Start Date End Date Taking? Authorizing Provider  acyclovir (ZOVIRAX) 400 MG tablet Take 1 tablet (400 mg total) by mouth 4 (four) times daily. 07/05/14   Margarita Grizzle, MD  HYDROcodone-acetaminophen (NORCO/VICODIN) 5-325 MG per tablet Take 2 tablets by mouth every 6 (six) hours as needed for moderate pain. 07/08/14   Rolland Porter, MD  metoCLOPramide (REGLAN) 10 MG tablet Take 1 tablet (10 mg total) by mouth every 6 (six) hours as needed (for nausea or headache). 07/03/14   John Molpus, MD  omeprazole (PRILOSEC) 20 MG capsule Take 1 capsule (20 mg total) by mouth 2 (two) times daily. 07/08/14   Rolland Porter, MD  oxyCODONE-acetaminophen (PERCOCET/ROXICET) 5-325 MG per tablet Take 1 tablet by mouth every 4 (four) hours as needed for severe pain. 07/05/14   Margarita Grizzle, MD   BP 142/89 mmHg  Pulse 58  Temp(Src) 97.7 F (36.5 C) (Oral)  Resp 16  Ht  (1.727 m)  Wt 155 lb (70.308 kg)  BMI 23.57 kg/m2  SpO2 100% Physical Exam  Constitutional: He is oriented to person, place, and time. He appears well-developed and well-nourished. No distress.  HENT:  Head: Normocephalic.    Normal facial strength. Symmetric, without droop. No eye pain. No conjunctivitis. No lesions on the TMs.  ie  clinically no signs of ophthalmicus, or [place].  Eyes: Conjunctivae are normal. Pupils are equal, round, and reactive to light. No scleral  icterus.  Neck: Normal range of motion. Neck supple. No thyromegaly present.  Cardiovascular: Normal rate and regular rhythm.  Exam reveals no gallop and no friction rub.   No murmur heard. Pulmonary/Chest: Effort normal and breath sounds normal. No respiratory distress. He has no wheezes. He has no rales.   Abdominal: Soft. Bowel sounds are normal. He exhibits no distension. There is no tenderness. There is no rebound.    Musculoskeletal: Normal range of motion.  Neurological: He is alert and oriented to person, place, and time.  Skin: Skin is warm and dry. No rash noted.  Psychiatric: He has a normal mood and affect. His behavior is normal.    ED Course  Procedures (including critical care time) Labs Review Labs Reviewed - No data to display  Imaging Review No results found.   EKG Interpretation None      MDM   Final diagnoses:  Gastritis  Herpes zoster    Symptoms seem related to his use of Motrin. He is a nonsmoker. Does not drink. It lasted avoid all anti-inflammatory medications. Use either Tylenol, or the prescription Vicodin, but not both. Prilosec twice a day until improving, then daily until completion of all medications. Given return precautions. Asked to recheck if he develops eye pain, or facial weakness/drooping.    Rolland PorterMark Jayleen Scaglione, MD 07/08/14 [id number]

## 2014-07-08 NOTE — ED Notes (Signed)
Pt was seen here previously for HA and diagnosed with shingles.  Pt has dried rash to left upper forehead.  Pt reports that he has had a "stomach ache" for two days.  Pt denies any n/v/d and sts "I don't know if its from the medicine or what."

## 2015-02-22 IMAGING — CR DG HAND COMPLETE 3+V*R*
3 series · 3 of 3 positions shown · non-contrast
Comparison: None.

CLINICAL DATA: Hand injury

EXAM:
RIGHT HAND - COMPLETE 3+ VIEW

[x hand pa right]
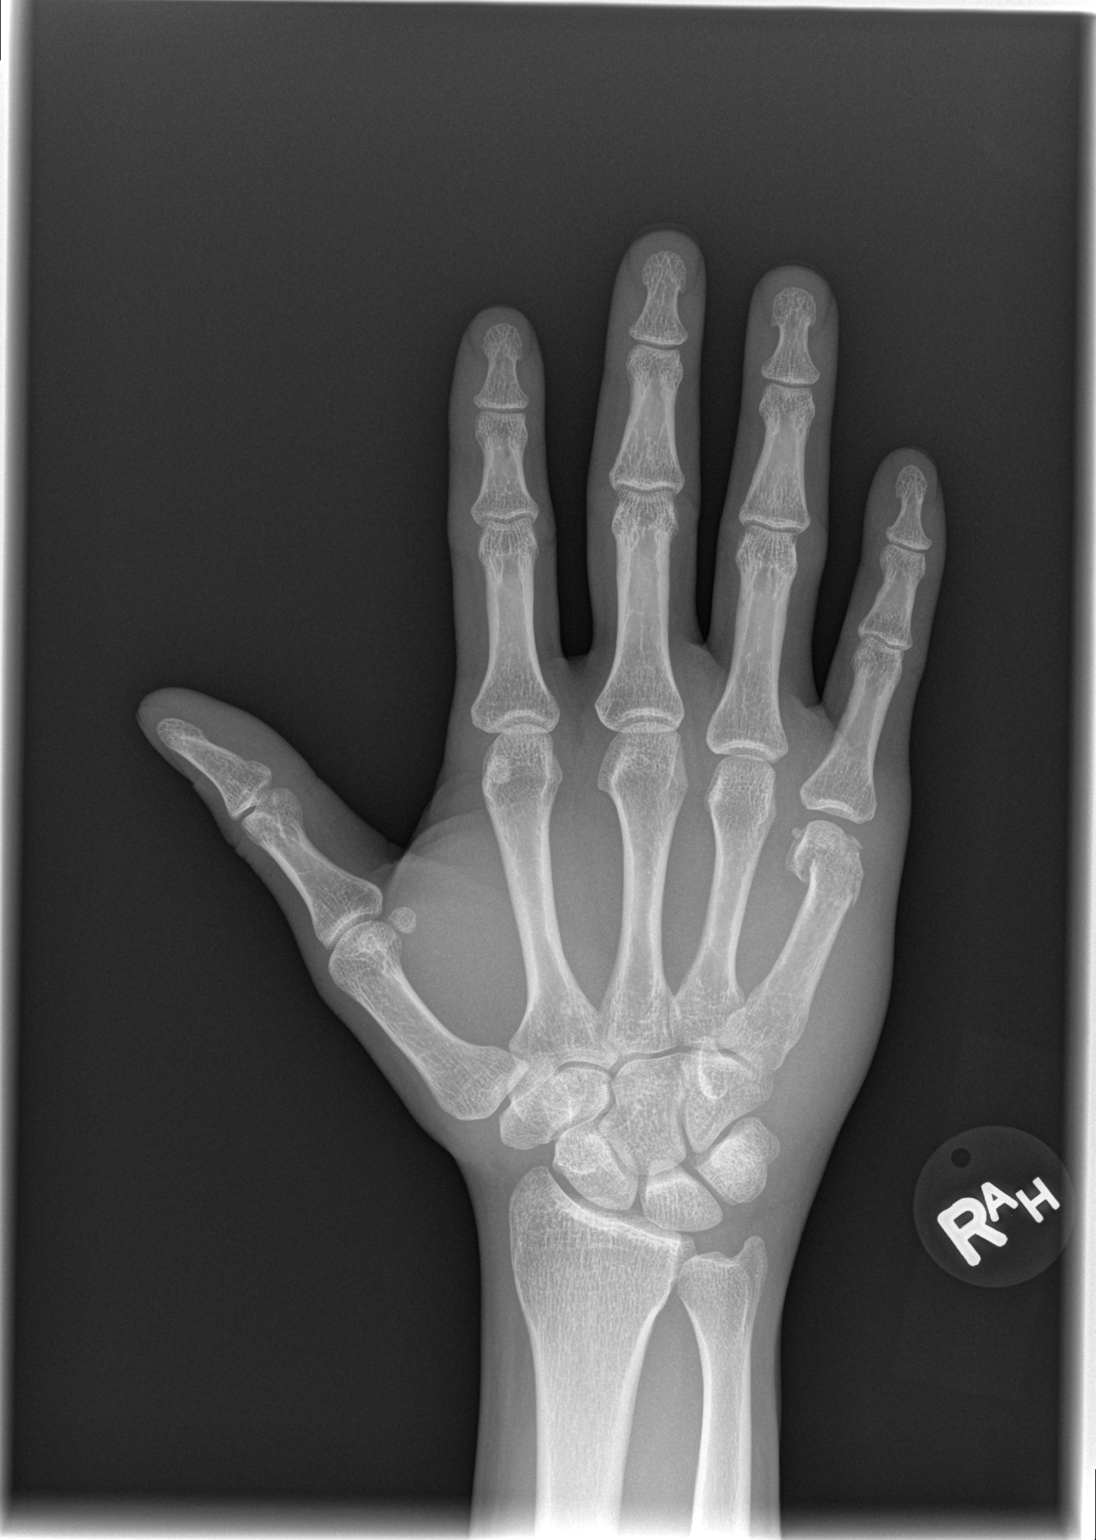

[x hand oblique right]
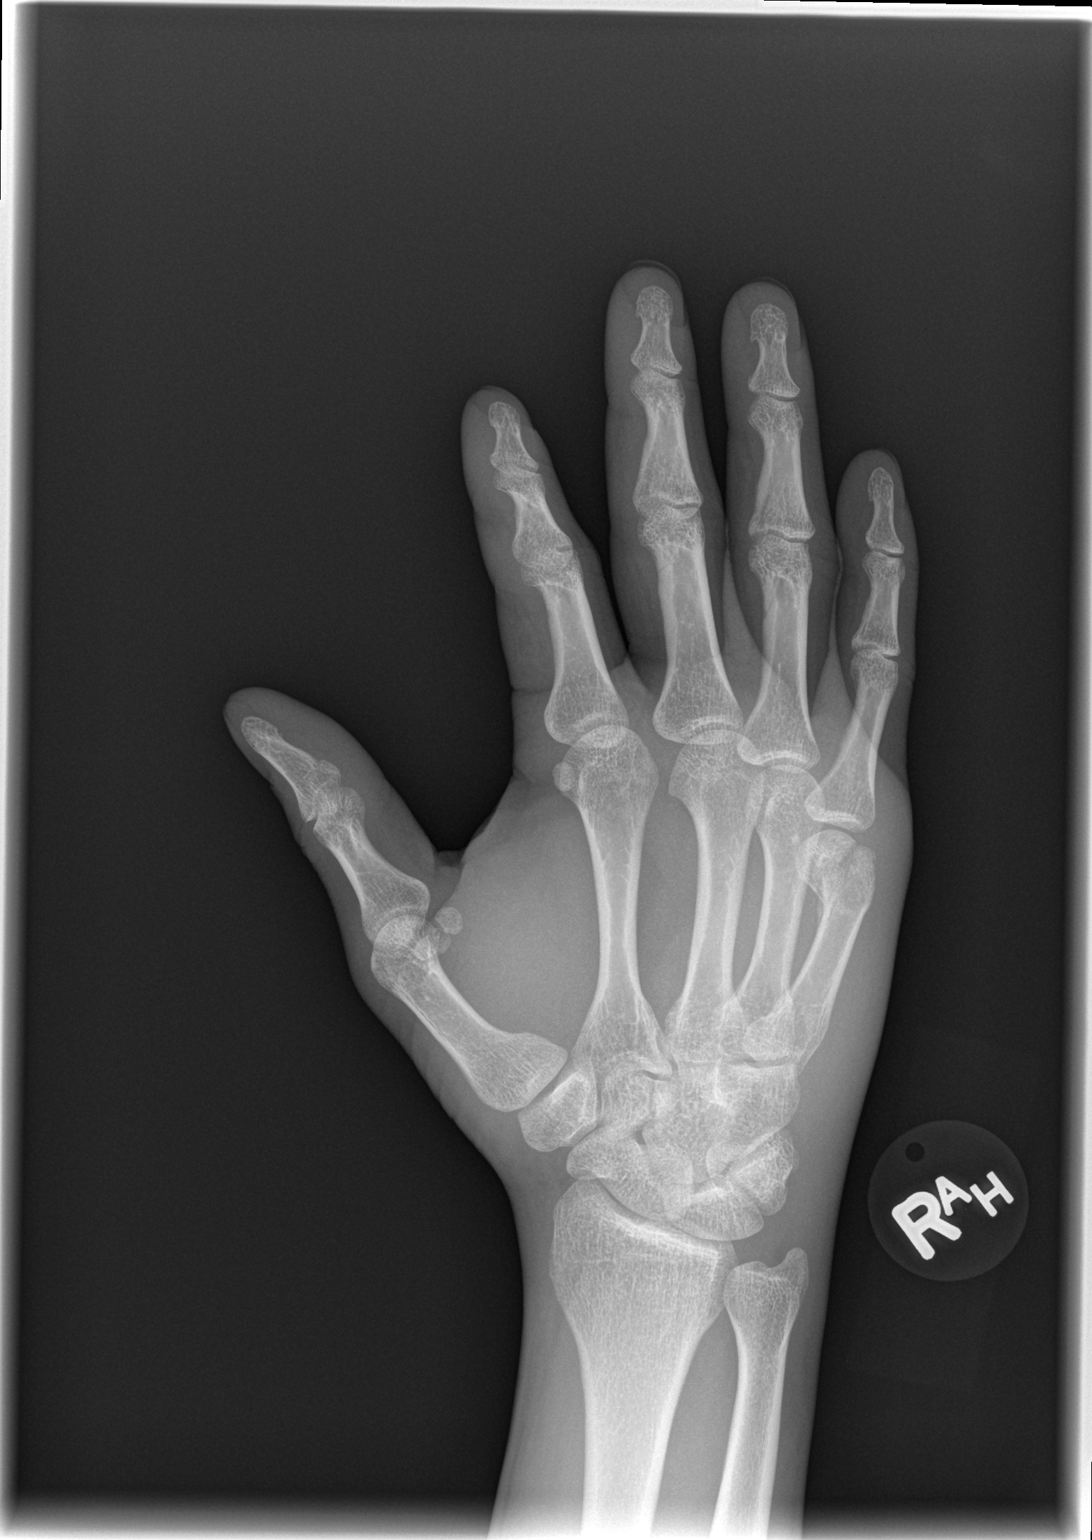

[x hand lat right]
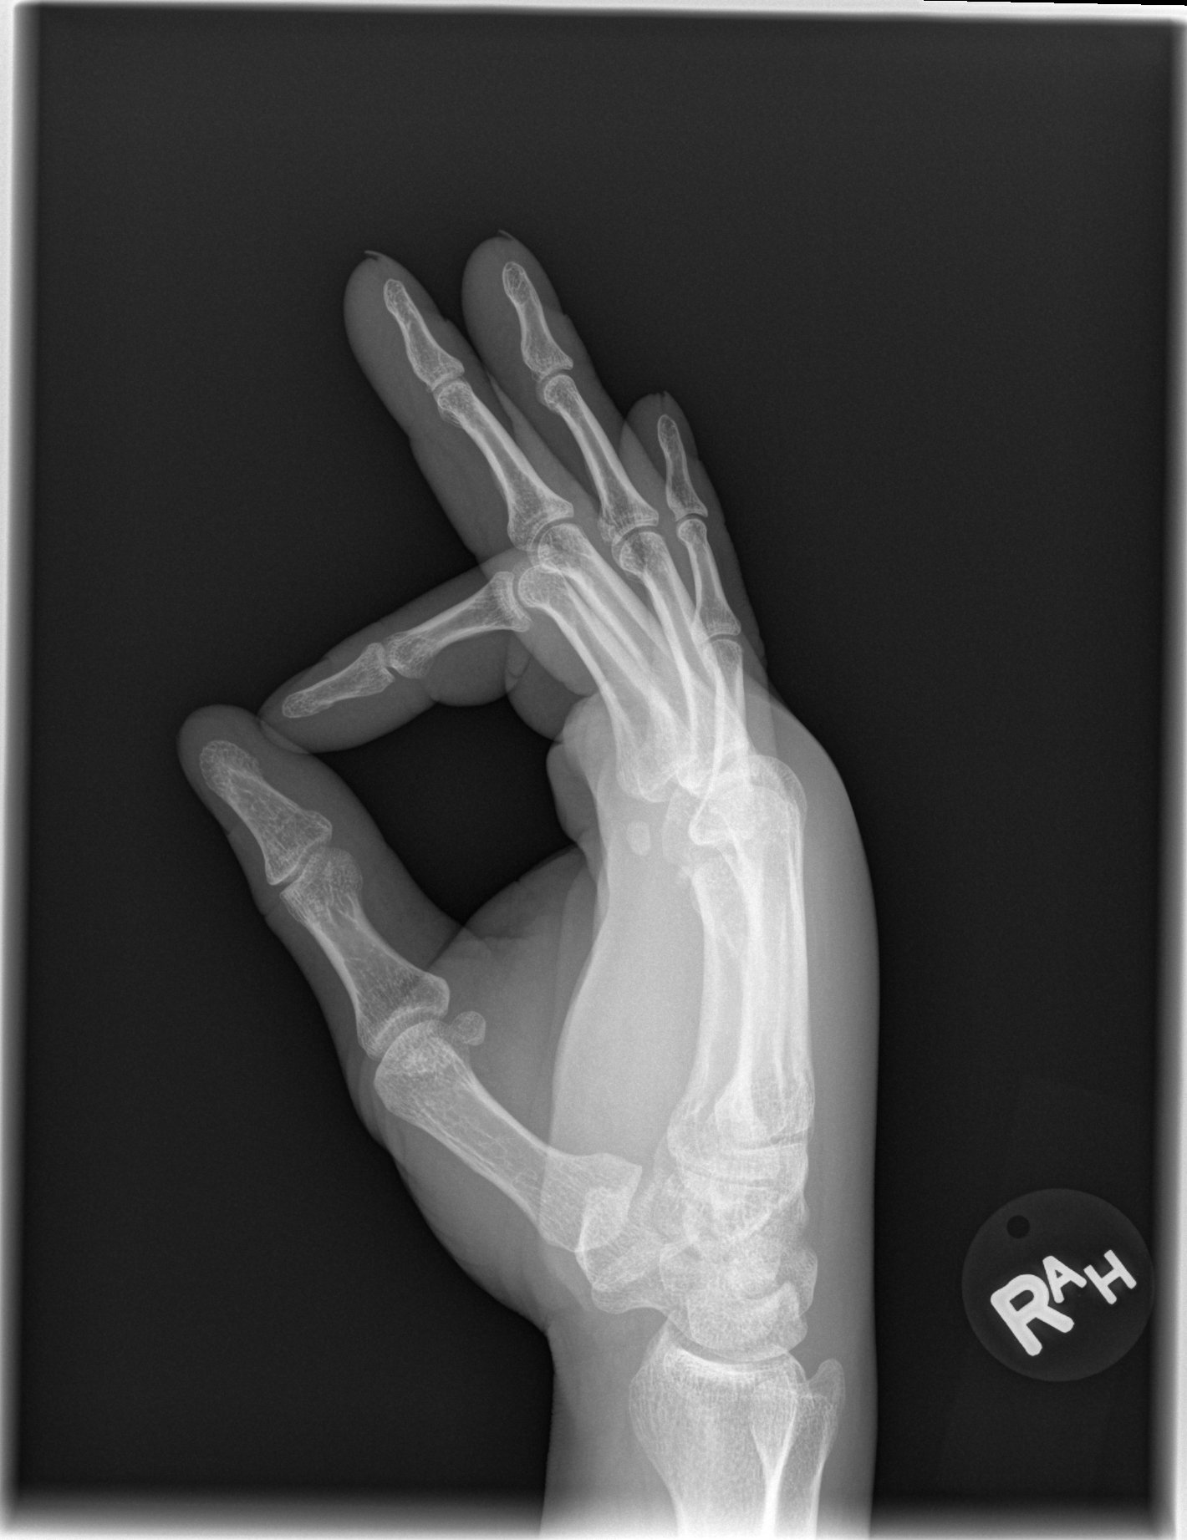

[3 of 3 positions shown; findings below may reference images not displayed]

FINDINGS: Comminuted fracture distal fifth metacarpal extending into the fifth
MCP joint. Mild angulation
IMPRESSION: Angulated fracture distal fifth metacarpal extending into the MCP
joint.

## 2015-05-01 ENCOUNTER — Encounter (HOSPITAL_BASED_OUTPATIENT_CLINIC_OR_DEPARTMENT_OTHER): Payer: Self-pay | Admitting: *Deleted

## 2015-05-01 ENCOUNTER — Emergency Department (HOSPITAL_BASED_OUTPATIENT_CLINIC_OR_DEPARTMENT_OTHER)
Admission: EM | Admit: 2015-05-01 | Discharge: 2015-05-02 | Disposition: A | Discharge status: Home / Self Care | Payer: Worker's Compensation | Attending: Emergency Medicine | Admitting: Emergency Medicine

## 2015-05-01 DIAGNOSIS — Y990 Civilian activity done for income or pay: Secondary | ICD-10-CM | POA: Insufficient documentation

## 2015-05-01 DIAGNOSIS — S058X1A Other injuries of right eye and orbit, initial encounter: Secondary | ICD-10-CM | POA: Insufficient documentation

## 2015-05-01 DIAGNOSIS — X58XXXA Exposure to other specified factors, initial encounter: Secondary | ICD-10-CM | POA: Insufficient documentation

## 2015-05-01 DIAGNOSIS — Z8619 Personal history of other infectious and parasitic diseases: Secondary | ICD-10-CM | POA: Insufficient documentation

## 2015-05-01 DIAGNOSIS — Y9389 Activity, other specified: Secondary | ICD-10-CM | POA: Insufficient documentation

## 2015-05-01 DIAGNOSIS — H938X2 Other specified disorders of left ear: Secondary | ICD-10-CM | POA: Insufficient documentation

## 2015-05-01 DIAGNOSIS — Y9289 Other specified places as the place of occurrence of the external cause: Secondary | ICD-10-CM | POA: Insufficient documentation

## 2015-05-01 DIAGNOSIS — Z79899 Other long term (current) drug therapy: Secondary | ICD-10-CM | POA: Insufficient documentation

## 2015-05-01 NOTE — ED Notes (Signed)
Pt c/o ? Metal in right eye x 12 hrs ago

## 2015-05-02 MED ORDER — ERYTHROMYCIN 5 MG/GM OP OINT
TOPICAL_OINTMENT | Freq: Four times a day (QID) | OPHTHALMIC | Status: DC
  Administered 2015-05-02: 1 via OPHTHALMIC
  Filled 2015-05-02: qty 3.5

## 2015-05-02 MED ORDER — HYDROCODONE-ACETAMINOPHEN 5-325 MG PO TABS: 1.0000 | ORAL_TABLET | ORAL | Status: DC | PRN

## 2015-05-02 MED ORDER — TETRACAINE HCL 0.5 % OP SOLN
1.0000 [drp] | Freq: Once | OPHTHALMIC | Status: AC
  Administered 2015-05-02: 1 [drp] via OPHTHALMIC
  Filled 2015-05-02: qty 4

## 2015-05-02 MED ORDER — FLUORESCEIN SODIUM 1 MG OP STRP
1.0000 | ORAL_STRIP | Freq: Once | OPHTHALMIC | Status: AC
  Administered 2015-05-02: 1 via OPHTHALMIC
  Filled 2015-05-02: qty 1

## 2015-05-02 MED ORDER — HYDROCODONE-ACETAMINOPHEN 5-325 MG PO TABS
2.0000 | ORAL_TABLET | Freq: Once | ORAL | Status: AC
  Administered 2015-05-02: 2 via ORAL
  Filled 2015-05-02: qty 2

## 2015-05-02 NOTE — ED Provider Notes (Addendum)
CSN: [id number]     Arrival date & time 05/01/15  2246 History   First MD Initiated Contact with Patient 05/02/15 0017     Chief Complaint  Patient presents with  . Foreign Body in Eye     (Consider location/radiation/quality/duration/timing/severity/associated sxs/prior Treatment) HPI  This is a 25 year old male who was using a drill at work yesterday about 12 hours prior to arrival. Despite wearing goggles he felt something strike his right eye. He irrigated his eye for about 30 minutes. He continues to have a foreign body sensation in the right eye. He denies blurred vision. Symptoms are moderate.  Past Medical History  Diagnosis Date  . Shingles    History reviewed. No pertinent past surgical history. History reviewed. No pertinent family history. Social History  Substance Use Topics  . Smoking status: Never Smoker   . Smokeless tobacco: None  . Alcohol Use: No    Review of Systems  All other systems reviewed and are negative.   Allergies  Review of patient's allergies indicates no known allergies.  Home Medications   Prior to Admission medications   Medication Sig Start Date End Date Taking? Authorizing Provider  acyclovir (ZOVIRAX) 400 MG tablet Take 1 tablet (400 mg total) by mouth 4 (four) times daily. 07/05/14   Margarita Grizzleanielle Ray, MD  HYDROcodone-acetaminophen (NORCO) 5-325 MG tablet Take 1-2 tablets by mouth every 4 (four) hours as needed (for pain). 05/02/15   Lekita Kerekes, MD  metoCLOPramide (REGLAN) 10 MG tablet Take 1 tablet (10 mg total) by mouth every 6 (six) hours as needed (for nausea or headache). 07/03/14   Junious Ragone, MD  omeprazole (PRILOSEC) 20 MG capsule Take 1 capsule (20 mg total) by mouth 2 (two) times daily. 07/08/14   Rolland PorterMark James, MD   BP 143/96 mmHg  Pulse 66  Temp(Src) 98.7 F (37.1 C) (Oral)  Resp 18  SpO2 100%   Physical Exam  General: Well-developed, well-nourished male in no acute distress; appearance consistent with age of record HENT:  normocephalic; atraumatic Eyes: pupils equal, round and reactive to light; extraocular muscles intact; no foreign body seen; no hyphema; no fluoroscein uptake; no conjunctival injection Neck: supple Heart: regular rate and rhythm Lungs: Normal respiratory effort and excursion Abdomen: soft; nondistended Extremities: No deformity; full range of motion Neurologic: Awake, alert and oriented; motor function intact in all extremities and symmetric; no facial droop Skin: Warm and dry Psychiatric: Flat affect    ED Course  Procedures (including critical care time)   MDM     Paula LibraJohn Meenakshi Sazama, MD 05/02/15 [id number]  Paula LibraJohn Gatlyn Lipari, MD 05/02/15 [id number]  Paula LibraJohn Cassadee Vanzandt, MD 05/02/15 [id number]

## 2015-06-11 ENCOUNTER — Encounter (HOSPITAL_BASED_OUTPATIENT_CLINIC_OR_DEPARTMENT_OTHER): Payer: Self-pay | Admitting: *Deleted

## 2015-06-11 ENCOUNTER — Emergency Department (HOSPITAL_BASED_OUTPATIENT_CLINIC_OR_DEPARTMENT_OTHER)
Admission: EM | Admit: 2015-06-11 | Discharge: 2015-06-11 | Disposition: A | Discharge status: Home / Self Care | Payer: Self-pay | Attending: Emergency Medicine | Admitting: Emergency Medicine

## 2015-06-11 DIAGNOSIS — M791 Myalgia: Secondary | ICD-10-CM | POA: Insufficient documentation

## 2015-06-11 DIAGNOSIS — R509 Fever, unspecified: Secondary | ICD-10-CM | POA: Insufficient documentation

## 2015-06-11 DIAGNOSIS — R51 Headache: Secondary | ICD-10-CM | POA: Insufficient documentation

## 2015-06-11 DIAGNOSIS — R52 Pain, unspecified: Secondary | ICD-10-CM

## 2015-06-11 DIAGNOSIS — Z8619 Personal history of other infectious and parasitic diseases: Secondary | ICD-10-CM | POA: Insufficient documentation

## 2015-06-11 DIAGNOSIS — R519 Headache, unspecified: Secondary | ICD-10-CM

## 2015-06-11 MED ORDER — ONDANSETRON 4 MG PO TBDP: 4.0000 mg | ORAL_TABLET | Freq: Three times a day (TID) | ORAL | Status: AC | PRN

## 2015-06-11 MED ORDER — IBUPROFEN 800 MG PO TABS: 800.0000 mg | ORAL_TABLET | Freq: Three times a day (TID) | ORAL | Status: DC

## 2015-06-11 NOTE — Discharge Instructions (Signed)
You have been seen today for headache and body aches. Follow up with PCP as needed. Return to ED should symptoms worsen. Your symptoms are most likely caused by viral illness. Viral illnesses do not require antibiotics. Treatment is supportive care. Get plenty of rest and drink plenty of fluids. Ibuprofen or Tylenol for pain or fever. Zofran for nausea or vomiting. Return to the ED should you begin to get worse or have symptoms such as shortness of breath, chest pain, rashes or any other major complaints.   Emergency Department Resource Guide 1) Find a Doctor and Pay Out of Pocket Although you won't have to find out who is covered by your insurance plan, it is a good idea to ask around and get recommendations. You will then need to call the office and see if the doctor you have chosen will accept you as a new patient and what types of options they offer for patients who are self-pay. Some doctors offer discounts or will set up payment plans for their patients who do not have insurance, but you will need to ask so you aren't surprised when you get to your appointment.  2) Contact Your Local Health Department Not all health departments have doctors that can see patients for sick visits, but many do, so it is worth a call to see if yours does. If you don't know where your local health department is, you can check in your phone book. The CDC also has a tool to help you locate your state's health department, and many state websites also have listings of all of their local health departments.  3) Find a Walk-in Clinic If your illness is not likely to be very severe or complicated, you may want to try a walk in clinic. These are popping up all over the country in pharmacies, drugstores, and shopping centers. They're usually staffed by nurse practitioners or physician assistants that have been trained to treat common illnesses and complaints. They're usually fairly quick and inexpensive. However, if you have  serious medical issues or chronic medical problems, these are probably not your best option.  No Primary Care Doctor: - Call Health Connect at  [phone] - they can help you locate a primary care doctor that  accepts your insurance, provides certain services, etc. - Physician Referral Service- [phone]  Chronic Pain Problems: Organization         Address  Phone   Notes  [facility]  [phone] Patients need to be referred by their primary care doctor.   Medication Assistance: Organization         Address  Phone   Notes  [facility] [address] [place] [place], [place] [address] [phone] --Must be a resident of [facility] -- Must have NO insurance coverage whatsoever (no Medicaid/ Medicare, etc.) -- The pt. MUST have a primary care doctor that directs their care regularly and follows them in the community   MedAssist  [phone]   [employer]  [phone]    Agencies that provide inexpensive medical care: Organization         Address  Phone   Notes  [facility]  [phone]   [facility]    [phone]   [facility] [address] [place], [place] [address] [phone]   [facility] [address] [place] [phone]   Planned Parenthood    (  [phone]   [facility]    [phone]   [facility]  [address], [place] Phone:  [phone], Fax:  [phone] Hours of Operation:  9 am - 6 pm, M-F.  Also accepts Medicaid/Medicare and self-pay.  [facility] for [place], [address], [place] Phone: [phone], Fax: [phone]. Hours of Operation:  8:30 am - 5:30 pm, M-F.  Also accepts Medicaid and self-pay.  [facility] [place] [address] [place] Phone: [phone]   [place],  [place], [place] [phone], Ext. [phone] Mondays & Thursdays: 7-9 AM.  First 15 patients are seen on a first come, first serve basis.    [place] Providers:  Organization         Address  Phone   Notes  [facility] [address], [place] [phone] Also accepts self-pay patients.  [facility] [id number] [place], [place]  [phone]   [place], [address], [place] [phone]   [facility] [address], [place] [phone]   Lucianne Lei [address], [place]   [phone] Only accepts [place] Access [place] patients after they have their name applied to their card.   Self-Pay (no insurance) in [facility]:  Organization         Address  Phone   Notes  Sickle Cell Patients, [facility] [place] [phone]   [facility] [place] [phone]   [facility] [place], [place], [place] [phone]   [facility]/Dr. Osei-Bonsu  [address], [place] or [address], [place] [phone] Phone number for both [place] and [place] locations is the same.  [facility] [address] [place] [phone]   [facility] [address] [place] or [address] [phone]   [facility] [address] [place] [phone], phone; [phone], fax Sees patients 1st and 3rd Saturday of every month.  Must not qualify for public or private insurance (i.e. Medicaid, Medicare, [place] Health Choice, Veterans' Benefits)  Household income should be no more than 200% of the poverty level The clinic cannot treat you if you are pregnant or think you are pregnant   Sexually transmitted diseases are not treated at the clinic.    Dental Care: Organization         Address  Phone  Notes  [facility] [phone] Accepts children up to age 37 who are enrolled in [place] or [place]; pregnant women with a Medicaid card; and children who have applied for Medicaid or [place] Health Choice, but were declined, whose parents can pay a reduced fee at time of service.  [facility]  [address], [place] [phone] Accepts children up to age 15 who are enrolled in [place] or [place]; pregnant women with a Medicaid card; and children who have applied for Medicaid or [place], but were declined, whose parents can pay a  reduced fee at time of service.  [place] Adult [facility] Access PROGRAM  [place] [phone] Patients are seen by appointment only. Walk-ins are not accepted. [place] will see patients 74 years of age and older. Monday - Tuesday (8am-5pm) Most Wednesdays (8:30-5pm) $30 per visit, cash only  [facility] Adult Dental Access PROGRAM  [address], [facility] [phone] Patients are seen by appointment only. Walk-ins are not accepted. [place] will see patients 35 years of age and older. One Wednesday Evening (Monthly: Volunteer Based).  $30 per visit, cash only  [place]  [phone] for adults; Children under age 59, call Graduate Pediatric Dentistry at [phone]. Children aged 51-14, please call [phone] to request a pediatric application.  Dental services are provided in all areas of dental care including fillings, crowns and bridges, complete and partial dentures, implants, gum treatment, root canals, and extractions. Preventive care is also provided. Treatment is provided to both adults and children. Patients  are selected via a lottery and there is often a waiting list.   [facility] [address] [place]  [phone] [email or web address]   [facility] [address] [place], [place] [phone], Ext. [phone] Second and Fourth Thursday of each month, opens at 6:30 AM; Clinic ends at 9 AM.  Patients are seen on a first-come first-served basis, and a limited number are seen during each clinic.   [facility]  [address] Hillard Danker [place], [place] [phone]   Eligibility Requirements You must have lived in [place], [place], or [place] counties for at least the last three months.   You cannot be eligible for state or federal sponsored [employer], including [employer], [place], or [employer].   You generally cannot be eligible for healthcare insurance through your employer.    How to apply: Eligibility screenings are held every Tuesday and Wednesday afternoon from 1:00 pm until 4:00 pm. You do not need an appointment for the interview!  [facility] [address] [place], [place]   [place]  [place] Department  [place]  [phone]    Behavioral Health Resources in the Community: Intensive Outpatient Programs Organization         Address  Phone  Notes  [place]. [address], [place], [place] [phone]   [facility] Outpatient [address], [place], [place]   ADS: Alcohol & Drug Svcs [address] [place], [place]   [place] [address],  [place], [place] or [phone]   Substance Abuse Resources Organization         Address  Phone  Notes  Alcohol and Drug Services  [phone]   [place]  [phone]   The [place]   Chinita Pester  [phone]    Residential & Outpatient Substance Abuse Program  [phone]   Psychological Services Organization         Address  Phone  Notes  [facility] [place]  [place]  [phone]   [place] [address], [place] or [phone]    Mobile Crisis Teams Organization         Address  Phone  Notes  [facility], Mobile Crisis Care Unit  [phone]  [facility]  [address] [place], [place]   [facility] [address] [place] [phone]    Self-Help/Support Groups Organization         Address  Phone             Notes  [facility] of [place] - variety of support groups  [place] Call for more information  Narcotics Anonymous (NA), [facility] [address], [employer] [place]  2 meetings at this location   Special educational needs teacher         Address  Phone  Notes  [facility]idential Treatment [place],    [place]  [phone]   [facility]  [address], [place] [phone], [place], [place]   [place], [place] [phone] Admissions: 8am-3pm M-F  Incentives Substance [place] [address]    [place], [place] [phone]   [facility] [address] [place], [place], [place]   [facility] [address]  [place] [address], [place]   [facility] - Intensive Outpatient [address], [place], [place]   [facility] ([place].) [place].,  [place], [place] [phone] or [phone]   [facility] (RTS) [address], [place], [place] Accepts Medicaid  [facility] [place] [address],  [place] [phone] Substance Abuse/Addiction Treatment   [facility] Organization         Address  Phone  Notes  [facility]  [phone]   Domenic Schwab, PhD [address] Arlis Porta [place], [place]   [phone] or [phone]   [facility] [place], [place] [phone]   [facility] [address] [place], [place] [phone] Insurance/Medicaid/sponsorship through [facility] and Families [address], [address] [place]                                    [place], [place] [phone] [place] [address] [place] [address], [place] [phone]    Dr. Adele Schilder  [phone]   [facility] Dept. 1) [address] [place] 2) [place] 3)  [place] [address], [place] [phone]  [phone]   [place] [phone] or [phone] (After Hours)

## 2015-06-11 NOTE — ED Provider Notes (Signed)
CSN: [id number]     Arrival date & time 06/11/15  1027 History   First MD Initiated Contact with Patient 06/11/15 1047     Chief Complaint  Patient presents with  . Generalized Body Aches     (Consider location/radiation/quality/duration/timing/severity/associated sxs/prior Treatment) HPI   Anquan Difiore is a 25 y.o. male, with a history of shingles, presenting to the ED with bilateral headache and bilateral upper body pain beginning last night. Pt rates his pain in both locations at 6/10, throbbing and aching, non-radiating. Pt has tried tylenol with minimal relief. Pt also complains of subjective fever. Pt has had sick contacts with similar symptoms. Pt denies N/V/D, abdominal pain, shortness of breath, cough, or any other complaints.     Past Medical History  Diagnosis Date  . Shingles    History reviewed. No pertinent past surgical history. No family history on file. Social History  Substance Use Topics  . Smoking status: Never Smoker   . Smokeless tobacco: None  . Alcohol Use: No    Review of Systems  Constitutional: Positive for fever. Negative for diaphoresis.  Respiratory: Negative for chest tightness and shortness of breath.   Cardiovascular: Negative for chest pain.  Gastrointestinal: Negative for nausea, vomiting and abdominal pain.  Genitourinary: Negative for dysuria, hematuria and testicular pain.  Musculoskeletal:       Upper body pain  Neurological: Positive for headaches. Negative for dizziness, weakness, light-headedness and numbness.  All other systems reviewed and are negative.     Allergies  Review of patient's allergies indicates no known allergies.  Home Medications   Prior to Admission medications   Medication Sig Start Date End Date Taking? Authorizing Provider  ibuprofen (ADVIL,MOTRIN) 800 MG tablet Take 1 tablet (800 mg total) by mouth 3 (three) times daily. 06/11/15   Shawn C Joy, PA-C  ondansetron (ZOFRAN ODT) 4 MG disintegrating tablet  Take 1 tablet (4 mg total) by mouth every 8 (eight) hours as needed for nausea or vomiting. 06/11/15   Shawn C Joy, PA-C   BP 130/79 mmHg  Pulse 84  Temp(Src) 98.4 F (36.9 C) (Oral)  Resp 18  Ht  (1.702 m)  Wt 68.04 kg  BMI 23.49 kg/m2  SpO2 98% Physical Exam  Constitutional: He is oriented to person, place, and time. He appears well-developed and well-nourished. No distress.  HENT:  Head: Normocephalic and atraumatic.  Right Ear: Tympanic membrane, external ear and ear canal normal.  Left Ear: Tympanic membrane, external ear and ear canal normal.  Mouth/Throat: Oropharynx is clear and moist.  Eyes: Conjunctivae and EOM are normal. Pupils are equal, round, and reactive to light.  Neck: Normal range of motion. Neck supple.  Cardiovascular: Normal rate, regular rhythm, normal heart sounds and intact distal pulses.   Pulmonary/Chest: Effort normal and breath sounds normal. No respiratory distress.  Abdominal: Soft. Bowel sounds are normal. There is no tenderness. There is no guarding.  Musculoskeletal: He exhibits no edema or tenderness.  Full ROM in all extremities and spine. No paraspinal tenderness.   Lymphadenopathy:    He has no cervical adenopathy.  Neurological: He is alert and oriented to person, place, and time. He has normal reflexes.  No sensory deficits. Strength 5/5 in all extremities. No gait disturbance. Coordination intact. Cranial nerves III-XII grossly intact. No facial droop.   Skin: Skin is warm and dry. He is not diaphoretic.  Psychiatric: He has a normal mood and affect. His behavior is normal.  Nursing note and vitals reviewed.  ED Course  Procedures (including critical care time)   MDM   Final diagnoses:  Acute nonintractable headache, unspecified headache type  Body aches    Antavious Gelin presents with headache and body aches since yesterday.  This could possibly be the start of a shingles outbreak. This is made less likely by the fact that  the discomfort is bilateral, however, it is still possible. Patient shows no signs of shingles on physical exam. Patient's symptoms are more likely to be caused by a viral illness such as influenza. Patient was given home care instructions and close return precautions. Patient voiced understanding of these instructions, accepts the plan, and is comfortable with discharge.  Anselm Pancoast, PA-C 06/11/15 1722  Derwood Kaplan, MD 06/13/15 [phone]

## 2015-06-11 NOTE — ED Notes (Signed)
C/o h/a and body aches since last pm. No other sx.

## 2015-07-11 ENCOUNTER — Emergency Department (HOSPITAL_BASED_OUTPATIENT_CLINIC_OR_DEPARTMENT_OTHER)
Admission: EM | Admit: 2015-07-11 | Discharge: 2015-07-11 | Disposition: A | Discharge status: Home / Self Care | Payer: Self-pay | Attending: Emergency Medicine | Admitting: Emergency Medicine

## 2015-07-11 ENCOUNTER — Encounter (HOSPITAL_BASED_OUTPATIENT_CLINIC_OR_DEPARTMENT_OTHER): Payer: Self-pay | Admitting: Emergency Medicine

## 2015-07-11 DIAGNOSIS — H6121 Impacted cerumen, right ear: Secondary | ICD-10-CM | POA: Insufficient documentation

## 2015-07-11 DIAGNOSIS — M545 Low back pain: Secondary | ICD-10-CM | POA: Insufficient documentation

## 2015-07-11 DIAGNOSIS — R51 Headache: Secondary | ICD-10-CM | POA: Insufficient documentation

## 2015-07-11 DIAGNOSIS — R6889 Other general symptoms and signs: Secondary | ICD-10-CM

## 2015-07-11 DIAGNOSIS — R509 Fever, unspecified: Secondary | ICD-10-CM | POA: Insufficient documentation

## 2015-07-11 DIAGNOSIS — Z8619 Personal history of other infectious and parasitic diseases: Secondary | ICD-10-CM | POA: Insufficient documentation

## 2015-07-11 DIAGNOSIS — Z791 Long term (current) use of non-steroidal anti-inflammatories (NSAID): Secondary | ICD-10-CM | POA: Insufficient documentation

## 2015-07-11 MED ORDER — ACETAMINOPHEN 325 MG PO TABS
650.0000 mg | ORAL_TABLET | Freq: Once | ORAL | Status: AC | PRN
  Administered 2015-07-11: 650 mg via ORAL
  Filled 2015-07-11: qty 2

## 2015-07-11 MED ORDER — ACETAMINOPHEN 500 MG PO TABS: 500.0000 mg | ORAL_TABLET | Freq: Four times a day (QID) | ORAL | Status: AC | PRN

## 2015-07-11 NOTE — ED Provider Notes (Signed)
CSN: [id number]     Arrival date & time 07/11/15  2011 History   First MD Initiated Contact with Patient 07/11/15 2232     Chief Complaint  Patient presents with  . Generalized Body Aches     (Consider location/radiation/quality/duration/timing/severity/associated sxs/prior Treatment) HPI  25 year old male presenting with complaint of fever and body aches.  Today, Pt developed generalized body aches all over, chills, headache, fever.  Body aches is 4/10. He feels tired, but after receiving Tylenol in the ED, patient felt much better. Patient denies ear pain, sore throat, sneeze, cough, cp, sob, n/v/d, dysuria.  Daughter was vomiting this AM but has since resolved. No other sick contact. No history of immunocompromise state such as HIV or diabetes. Has history of shingle involving the face last year but denies having any similar symptoms.  Past Medical History  Diagnosis Date  . Shingles    History reviewed. No pertinent past surgical history. No family history on file. Social History  Substance Use Topics  . Smoking status: Never Smoker   . Smokeless tobacco: None  . Alcohol Use: Yes     Comment: occ    Review of Systems  All other systems reviewed and are negative.     Allergies  Review of patient's allergies indicates no known allergies.  Home Medications   Prior to Admission medications   Medication Sig Start Date End Date Taking? Authorizing Provider  ibuprofen (ADVIL,MOTRIN) 800 MG tablet Take 1 tablet (800 mg total) by mouth 3 (three) times daily. 06/11/15   Shawn C Joy, PA-C  ondansetron (ZOFRAN ODT) 4 MG disintegrating tablet Take 1 tablet (4 mg total) by mouth every 8 (eight) hours as needed for nausea or vomiting. 06/11/15   Shawn C Joy, PA-C   BP 130/73 mmHg  Pulse 102  Temp(Src) 100.7 F (38.2 C) (Oral)  Resp 20  Ht  (1.702 m)  Wt 72.576 kg  BMI 25.05 kg/m2  SpO2 97% Physical Exam  Constitutional: He is oriented to person, place, and time. He  appears well-developed and well-nourished. No distress.  African-American male nontoxic in appearance, ambulate without difficulty  HENT:  Head: Atraumatic.  Ears: Cerumen impaction in right ear, normal TM and left ear Nose: Normal nares bilaterally Throat: Uvula is midline, no tonsillar enlargement or exudates, mild posterior oropharyngeal erythema without trismus  Eyes: Conjunctivae are normal.  Neck: Normal range of motion. Neck supple.  No nuchal rigidity  Cardiovascular: Normal rate, regular rhythm and intact distal pulses.   Pulmonary/Chest: Effort normal and breath sounds normal. No respiratory distress. He has no wheezes. He exhibits no tenderness.  Abdominal: Soft. There is no tenderness.  Musculoskeletal: He exhibits tenderness (Mild paralumbar spinal muscle tenderness on palpation without significant midline spine tenderness. No overlying skin changes.).  Lymphadenopathy:    He has no cervical adenopathy.  Neurological: He is alert and oriented to person, place, and time.  Skin: No rash noted.  Psychiatric: He has a normal mood and affect.  Nursing note and vitals reviewed.   ED Course  Procedures (including critical care time)   MDM   Final diagnoses:  Flu-like symptoms    BP 128/70 mmHg  Pulse 84  Temp(Src) 99.6 F (37.6 C) (Oral)  Resp 18  Ht  (1.702 m)  Wt 72.576 kg  BMI 25.05 kg/m2  SpO2 97%   11:32 PM Patient presents with viral symptoms. He has no nuchal rigidity concerning for meningitis. He has no obvious signs to suggest bacterial infection.  He is well-appearing ambulate without difficulty. Symptomatic treatment recommended. Return precaution discussed. Otherwise patient is stable for discharge.  Fayrene HelperBowie Mirely Pangle, PA-C 07/11/15 [phone]  Tilden FossaElizabeth Rees, MD 07/12/15 [phone]

## 2015-07-11 NOTE — Discharge Instructions (Signed)
Viral Infections °A viral infection can be caused by different types of viruses. Most viral infections are not serious and resolve on their own. However, some infections may cause severe symptoms and may lead to further complications. °SYMPTOMS °Viruses can frequently cause: °· Minor sore throat. °· Aches and pains. °· Headaches. °· Runny nose. °· Different types of rashes. °· Watery eyes. °· Tiredness. °· Cough. °· Loss of appetite. °· Gastrointestinal infections, resulting in nausea, vomiting, and diarrhea. °These symptoms do not respond to antibiotics because the infection is not caused by bacteria. However, you might catch a bacterial infection following the viral infection. This is sometimes called a "superinfection." Symptoms of such a bacterial infection may include: °· Worsening sore throat with pus and difficulty swallowing. °· Swollen neck glands. °· Chills and a high or persistent fever. °· Severe headache. °· Tenderness over the sinuses. °· Persistent overall ill feeling (malaise), muscle aches, and tiredness (fatigue). °· Persistent cough. °· Yellow, green, or brown mucus production with coughing. °HOME CARE INSTRUCTIONS  °· Only take over-the-counter or prescription medicines for pain, discomfort, diarrhea, or fever as directed by your caregiver. °· Drink enough water and fluids to keep your urine clear or pale yellow. Sports drinks can provide valuable electrolytes, sugars, and hydration. °· Get plenty of rest and maintain proper nutrition. Soups and broths with crackers or rice are fine. °SEEK IMMEDIATE MEDICAL CARE IF:  °· You have severe headaches, shortness of breath, chest pain, neck pain, or an unusual rash. °· You have uncontrolled vomiting, diarrhea, or you are unable to keep down fluids. °· You or your child has an oral temperature above 102° F (38.9° C), not controlled by medicine. °· Your baby is older than 3 months with a rectal temperature of 102° F (38.9° C) or higher. °· Your baby is 3  months old or younger with a rectal temperature of 100.4° F (38° C) or higher. °MAKE SURE YOU:  °· Understand these instructions. °· Will watch your condition. °· Will get help right away if you are not doing well or get worse. °  °This information is not intended to replace advice given to you by your health care provider. Make sure you discuss any questions you have with your health care provider. °  °Document Released: 01/20/2005 Document Revised: 07/05/2011 Document Reviewed: 09/18/2014 °Elsevier Interactive Patient Education ©2016 Elsevier Inc. ° °

## 2015-07-11 NOTE — ED Notes (Signed)
Pt states generalized body aches and chills since this morning. Per girlfriend, no vomiting or specified regional pain, just diffuse pain "all over".

## 2015-07-11 NOTE — ED Notes (Signed)
Body aches and HA since 1pm today.  Denies sore throat or cough.  Denies n/v/d.

## 2018-12-12 ENCOUNTER — Emergency Department (HOSPITAL_BASED_OUTPATIENT_CLINIC_OR_DEPARTMENT_OTHER)
Admission: EM | Admit: 2018-12-12 | Discharge: 2018-12-12 | Disposition: A | Discharge status: Home / Self Care | Payer: No Typology Code available for payment source | Attending: Emergency Medicine | Admitting: Emergency Medicine

## 2018-12-12 ENCOUNTER — Encounter (HOSPITAL_BASED_OUTPATIENT_CLINIC_OR_DEPARTMENT_OTHER): Payer: Self-pay

## 2018-12-12 ENCOUNTER — Other Ambulatory Visit: Payer: Self-pay

## 2018-12-12 DIAGNOSIS — Z041 Encounter for examination and observation following transport accident: Secondary | ICD-10-CM | POA: Insufficient documentation

## 2018-12-12 DIAGNOSIS — M25561 Pain in right knee: Secondary | ICD-10-CM | POA: Diagnosis not present

## 2018-12-12 DIAGNOSIS — M546 Pain in thoracic spine: Secondary | ICD-10-CM | POA: Diagnosis not present

## 2018-12-12 MED ORDER — METHOCARBAMOL 500 MG PO TABS: 500.0000 mg | ORAL_TABLET | Freq: Two times a day (BID) | ORAL | 0 refills | Status: AC

## 2018-12-12 MED ORDER — DICLOFENAC SODIUM 1 % TD GEL: 4.0000 g | Freq: Four times a day (QID) | TRANSDERMAL | 1 refills | Status: DC

## 2018-12-12 NOTE — Discharge Instructions (Addendum)
Expect your soreness to increase over the next 2-3 days. Take it easy, but do not lay around too much as this may make any stiffness worse.  °Antiinflammatory medications: Take 600 mg of ibuprofen every 6 hours or 440 mg (over the counter dose) to 500 mg (prescription dose) of naproxen every 12 hours for the next 3 days. After this time, these medications may be used as needed for pain. Take these medications with food to avoid upset stomach. Choose only one of these medications, do not take them together. °Acetaminophen (generic for Tylenol): Should you continue to have additional pain while taking the ibuprofen or naproxen, you may add in acetaminophen as needed. Your daily total maximum amount of acetaminophen from all sources should be limited to 4000mg/day for persons without liver problems, or 2000mg/day for those with liver problems. °Diclofenac gel: This is a topical anti-inflammatory medication and can be applied directly to the painful region.  Do not use on the face or genitals.  This medication may be used as an alternative to oral anti-inflammatory medications, such as ibuprofen or naproxen. °Methocarbamol: Methocarbamol (generic for Robaxin) is a muscle relaxer and can help relieve stiff muscles or muscle spasms.  Do not drive or perform other dangerous activities while taking this medication as it can cause drowsiness as well as changes in reaction time and judgement. °Lidocaine patches: These are available via either prescription or over-the-counter. The over-the-counter option may be more economical one and are likely just as effective. There are multiple over-the-counter brands, such as Salonpas. °Exercises: Be sure to perform the attached exercises starting with three times a week and working up to performing them daily. This is an essential part of preventing long term problems.  °Follow up: Follow up with a primary care provider for any future management of these complaints. Be sure to follow up  within 7-10 days. °Return: Return to the ED should symptoms worsen. ° °For prescription assistance, may try using prescription discount sites or apps, such as goodrx.com °
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
## 2019-01-10 ENCOUNTER — Other Ambulatory Visit: Payer: Self-pay

## 2019-01-10 ENCOUNTER — Emergency Department (HOSPITAL_BASED_OUTPATIENT_CLINIC_OR_DEPARTMENT_OTHER)
Admission: EM | Admit: 2019-01-10 | Discharge: 2019-01-10 | Disposition: A | Discharge status: Home / Self Care | Payer: Self-pay | Attending: Emergency Medicine | Admitting: Emergency Medicine

## 2019-01-10 ENCOUNTER — Encounter (HOSPITAL_BASED_OUTPATIENT_CLINIC_OR_DEPARTMENT_OTHER): Payer: Self-pay

## 2019-01-10 DIAGNOSIS — Y998 Other external cause status: Secondary | ICD-10-CM | POA: Insufficient documentation

## 2019-01-10 DIAGNOSIS — Y9367 Activity, basketball: Secondary | ICD-10-CM | POA: Insufficient documentation

## 2019-01-10 DIAGNOSIS — S0181XA Laceration without foreign body of other part of head, initial encounter: Secondary | ICD-10-CM | POA: Insufficient documentation

## 2019-01-10 DIAGNOSIS — Y92310 Basketball court as the place of occurrence of the external cause: Secondary | ICD-10-CM | POA: Insufficient documentation

## 2019-01-10 DIAGNOSIS — W500XXA Accidental hit or strike by another person, initial encounter: Secondary | ICD-10-CM | POA: Insufficient documentation

## 2019-01-10 NOTE — ED Notes (Signed)
ED Provider at bedside. 

## 2019-01-10 NOTE — ED Provider Notes (Signed)
[place] EMERGENCY DEPARTMENT Provider Note   CSN: [id number] Arrival date & time: 01/10/19  1934     History   Chief Complaint Chief Complaint  Patient presents with  . Facial Laceration    HPI Joshua Joseph is a 28 y.o. male.     HPI Patient was playing basketball and he got elbowed beside the eye.  He reports that there was bleeding but it stopped now.  Denies any injury to the eye.  No loss of consciousness. Past Medical History:  Diagnosis Date  . Shingles     Patient Active Problem List   Diagnosis Date Noted  . Boxer's fracture 09/28/2013    History reviewed. No pertinent surgical history.      Home Medications    Prior to Admission medications   Medication Sig Start Date End Date Taking? Authorizing Provider  acetaminophen (TYLENOL) 500 MG tablet Take 1 tablet (500 mg total) by mouth every 6 (six) hours as needed. 07/11/15   Domenic Moras, PA-C  diclofenac sodium (VOLTAREN) 1 % GEL Apply 4 g topically 4 (four) times daily. 12/12/18   Joy, Shawn C, PA-C  ibuprofen (ADVIL,MOTRIN) 800 MG tablet Take 1 tablet (800 mg total) by mouth 3 (three) times daily. 06/11/15   Joy, Shawn C, PA-C  methocarbamol (ROBAXIN) 500 MG tablet Take 1 tablet (500 mg total) by mouth 2 (two) times daily. 12/12/18   Joy, Shawn C, PA-C  ondansetron (ZOFRAN ODT) 4 MG disintegrating tablet Take 1 tablet (4 mg total) by mouth every 8 (eight) hours as needed for nausea or vomiting. 06/11/15   Joy, Helane Gunther, PA-C    Family History No family history on file.  Social History Social History   Tobacco Use  . Smoking status: Never Smoker  . Smokeless tobacco: Never Used  Substance Use Topics  . Alcohol use: Yes    Comment: occ  . Drug use: No     Allergies   Patient has no known allergies.   Review of Systems Review of Systems Constitutional: No recent fever chills or general illness. Neurologic: No headache no confusion no loss of consciousness.  Physical Exam Updated  Vital Signs BP 123/82 (BP Location: Left Arm)   Pulse 78   Temp 98.6 F (37 C) (Oral)   Resp 16   Ht 5\' 8"  (1.727 m)   Wt 80.3 kg   SpO2 99%   BMI 26.91 kg/m   Physical Exam Constitutional:      Appearance: Normal appearance.  HENT:     Head:     Comments: 1 cm linear laceration inferior to the brow on the left eye just through to the deeper dermis.  No active bleeding. Eyes:     Extraocular Movements: Extraocular movements intact.     Conjunctiva/sclera: Conjunctivae normal.     Pupils: Pupils are equal, round, and reactive to light.  Pulmonary:     Effort: Pulmonary effort is normal.  Musculoskeletal: Normal range of motion.  Skin:    General: Skin is warm and dry.  Neurological:     General: No focal deficit present.     Mental Status: He is alert and oriented to person, place, and time.     Coordination: Coordination normal.  Psychiatric:        Mood and Affect: Mood normal.      ED Treatments / Results  Labs (all labs ordered are listed, but only abnormal results are displayed) Labs Reviewed - No data to display  EKG  None  Radiology No results found.  Procedures .Marland Kitchen.Laceration Repair  Date/Time: 01/10/2019 11:32 PM Performed by: Arby BarrettePfeiffer, Lavilla Delamora, MD Authorized by: Arby BarrettePfeiffer, Finch Costanzo, MD   Consent:    Consent obtained:  Verbal   Consent given by:  Patient   Risks discussed:  Infection, need for additional repair, poor cosmetic result and poor wound healing Anesthesia (see MAR for exact dosages):    Anesthesia method:  None Laceration details:    Location:  Face   Face location:  L eyebrow   Length (cm):  1   Depth (mm):  2 Repair type:    Repair type:  Simple Exploration:    Contaminated: no   Treatment:    Area cleansed with:  Saline   Amount of cleaning:  Standard Skin repair:    Repair method:  Tissue adhesive Approximation:    Approximation:  Close   (including critical care time)  Medications Ordered in ED Medications - No data to  display   Initial Impression / Assessment and Plan / ED Course  I have reviewed the triage vital signs and the nursing notes.  Pertinent labs & imaging results that were available during my care of the patient were reviewed by me and considered in my medical decision making (see chart for details).        Patient with super Fishel laceration just below the left brow.  Closed with tissue adhesive with good approximation.  No other significant injuries.  Extraocular motions are normal with no visual complaints.  Final Clinical Impressions(s) / ED Diagnoses   Final diagnoses:  Facial laceration, initial encounter    ED Discharge Orders    None       Arby BarrettePfeiffer, Quadasia Newsham, MD 01/10/19 2334

## 2019-01-10 NOTE — ED Triage Notes (Signed)
Pt with elbow vs face playing basketball ~2 hours PTA-lac to left eyebrow-no bleeding 4x4 taped over lac-NAD-steady gait

## 2020-10-18 ENCOUNTER — Other Ambulatory Visit: Payer: Self-pay

## 2020-10-18 ENCOUNTER — Emergency Department (HOSPITAL_BASED_OUTPATIENT_CLINIC_OR_DEPARTMENT_OTHER)
Admission: EM | Admit: 2020-10-18 | Discharge: 2020-10-18 | Disposition: A | Discharge status: Home / Self Care | Payer: PRIVATE HEALTH INSURANCE | Attending: Emergency Medicine | Admitting: Emergency Medicine

## 2020-10-18 ENCOUNTER — Encounter (HOSPITAL_BASED_OUTPATIENT_CLINIC_OR_DEPARTMENT_OTHER): Payer: Self-pay | Admitting: Emergency Medicine

## 2020-10-18 DIAGNOSIS — R519 Headache, unspecified: Secondary | ICD-10-CM | POA: Insufficient documentation

## 2020-10-18 DIAGNOSIS — K029 Dental caries, unspecified: Secondary | ICD-10-CM | POA: Insufficient documentation

## 2020-10-18 MED ORDER — IBUPROFEN 800 MG PO TABS: 800.0000 mg | ORAL_TABLET | Freq: Three times a day (TID) | ORAL | 0 refills | Status: AC | PRN

## 2020-10-18 MED ORDER — PENICILLIN V POTASSIUM 500 MG PO TABS: 500.0000 mg | ORAL_TABLET | Freq: Four times a day (QID) | ORAL | 0 refills | Status: AC

## 2020-10-18 NOTE — ED Triage Notes (Signed)
Brought by ems for c/o left upper tooth ache for the last month.  Reports worse today and has now turned into a migraine.

## 2020-10-18 NOTE — ED Provider Notes (Signed)
[facility] EMERGENCY DEPARTMENT Provider Note   CSN: [id number] Arrival date & time: 10/18/20  1957     History Chief Complaint  Patient presents with   Dental Pain    Joshua Joseph is a 30 y.o. male.  30 year old male who presents with dental pain.  Patient reports that he has had pain in a left upper molar intermittently over the past month but it has never been severe.  He has never seen a dentist for it before.  Normally the pain only happens when he drinks cold drinks but today he began having pain without eating anything in particular and the pain became severe.  Pain eventually led to migraine type headache.  No facial swelling or fevers.  He has intermittently taken Tylenol for his symptoms.  The history is provided by the patient.  Dental Pain Associated symptoms: headaches   Associated symptoms: no facial swelling and no fever       Past Medical History:  Diagnosis Date   Shingles     Patient Active Problem List   Diagnosis Date Noted   Boxer's fracture 09/28/2013    No past surgical history on file.     No family history on file.  Social History   Tobacco Use   Smoking status: Never   Smokeless tobacco: Never  Vaping Use   Vaping Use: Never used  Substance Use Topics   Alcohol use: Yes    Comment: occ   Drug use: No    Home Medications Prior to Admission medications   Medication Sig Start Date End Date Taking? Authorizing Provider  ibuprofen (ADVIL) 800 MG tablet Take 1 tablet (800 mg total) by mouth every 8 (eight) hours as needed for up to 4 days for moderate pain. 10/18/20 10/22/20 Yes Britania Shreeve, Ambrose Finland, MD  penicillin v potassium (VEETID) 500 MG tablet Take 1 tablet (500 mg total) by mouth 4 (four) times daily for 7 days. 10/18/20 10/25/20 Yes Aveah Castell, Ambrose Finland, MD  acetaminophen (TYLENOL) 500 MG tablet Take 1 tablet (500 mg total) by mouth every 6 (six) hours as needed. 07/11/15   Fayrene Helper, PA-C  methocarbamol (ROBAXIN) 500  MG tablet Take 1 tablet (500 mg total) by mouth 2 (two) times daily. 12/12/18   Joy, Shawn C, PA-C  ondansetron (ZOFRAN ODT) 4 MG disintegrating tablet Take 1 tablet (4 mg total) by mouth every 8 (eight) hours as needed for nausea or vomiting. 06/11/15   Joy, Shawn C, PA-C    Allergies    Patient has no known allergies.  Review of Systems   Review of Systems  Constitutional:  Negative for fever.  HENT:  Positive for dental problem. Negative for facial swelling and trouble swallowing.   Neurological:  Positive for headaches.   Physical Exam Updated Vital Signs BP (!) 145/106 (BP Location: Left Arm)   Pulse 66   Temp 98.1 F (36.7 C) (Oral)   Resp 18   Ht 5\' 8"  (1.727 m)   Wt 81.6 kg   SpO2 98%   BMI 27.37 kg/m   Physical Exam Vitals and nursing note reviewed.  Constitutional:      General: He is not in acute distress.    Appearance: He is well-developed.  HENT:     Head: Normocephalic and atraumatic.     Comments: No facial swelling    Mouth/Throat:     Mouth: Mucous membranes are moist.      Comments: Cavity on anterior side of L upper molar with  no mucosal swelling or fluctuance Eyes:     Conjunctiva/sclera: Conjunctivae normal.  Musculoskeletal:     Cervical back: Neck supple.  Lymphadenopathy:     Cervical: No cervical adenopathy.  Skin:    General: Skin is warm and dry.  Neurological:     Mental Status: He is alert and oriented to person, place, and time.  Psychiatric:        Judgment: Judgment normal.    ED Results / Procedures / Treatments   Labs (all labs ordered are listed, but only abnormal results are displayed) Labs Reviewed - No data to display  EKG None  Radiology No results found.  Procedures Procedures   Medications Ordered in ED Medications - No data to display  ED Course  I have reviewed the triage vital signs and the nursing notes.     MDM Rules/Calculators/A&P                          No evidence of abscess or deep space  infection on exam.  Will start on penicillin but emphasized the need to follow-up with dentist for definitive management.  Discussed pain control and supportive measures.  Reviewed return precautions. Final Clinical Impression(s) / ED Diagnoses Final diagnoses:  Pain due to dental caries    Rx / DC Orders ED Discharge Orders          Ordered    ibuprofen (ADVIL) 800 MG tablet  Every 8 hours PRN        10/18/20 2054    penicillin v potassium (VEETID) 500 MG tablet  4 times daily        10/18/20 2054             Oswald Pott, Ambrose Finland, MD 10/18/20 2059

## 2023-05-20 ENCOUNTER — Encounter (HOSPITAL_BASED_OUTPATIENT_CLINIC_OR_DEPARTMENT_OTHER): Payer: Self-pay | Admitting: Urology

## 2023-05-20 ENCOUNTER — Emergency Department (HOSPITAL_BASED_OUTPATIENT_CLINIC_OR_DEPARTMENT_OTHER)
Admission: EM | Admit: 2023-05-20 | Discharge: 2023-05-20 | Disposition: A | Discharge status: Home / Self Care | Payer: Self-pay | Attending: Emergency Medicine | Admitting: Emergency Medicine

## 2023-05-20 DIAGNOSIS — J09X2 Influenza due to identified novel influenza A virus with other respiratory manifestations: Secondary | ICD-10-CM | POA: Insufficient documentation

## 2023-05-20 DIAGNOSIS — J101 Influenza due to other identified influenza virus with other respiratory manifestations: Secondary | ICD-10-CM

## 2023-05-20 DIAGNOSIS — Z20822 Contact with and (suspected) exposure to covid-19: Secondary | ICD-10-CM | POA: Insufficient documentation

## 2023-05-20 LAB — RESP PANEL BY RT-PCR (RSV, FLU A&B, COVID)  RVPGX2
Influenza A by PCR: POSITIVE — AB
Influenza B by PCR: NEGATIVE
Resp Syncytial Virus by PCR: NEGATIVE
SARS Coronavirus 2 by RT PCR: NEGATIVE

## 2023-05-20 NOTE — ED Triage Notes (Signed)
Pt states headache and body aches that started on Wednesday Unknown fever but had chills  Tylenol last at 1800

## 2023-05-20 NOTE — ED Provider Notes (Signed)
[place] EMERGENCY DEPARTMENT AT [facility] Provider Note   CSN: [id number] Arrival date & time: 05/20/23  2226     History  Chief Complaint  Patient presents with   Flu like symptoms     Joshua Joseph is a 33 y.o. male.  Patient presents to the emergency department today for evaluation of flulike illness.  Symptoms started in the afternoon on 05/18/2023.  Patient is a [employer].  He developed body aches, headache and chills.  He has had occasional cough.  He has had a mild sore throat.  No nausea, vomiting, or diarrhea.  No documented fevers.  He has been taking Tylenol.  No chest pain or shortness of breath.  No leg swelling.  He was able to drive yesterday, but was very uncomfortable.  Denies discrete sick contacts.       Home Medications Prior to Admission medications   Medication Sig Start Date End Date Taking? Authorizing Provider  acetaminophen (TYLENOL) 500 MG tablet Take 1 tablet (500 mg total) by mouth every 6 (six) hours as needed. 07/11/15   Fayrene Helper, PA-C  methocarbamol (ROBAXIN) 500 MG tablet Take 1 tablet (500 mg total) by mouth 2 (two) times daily. 12/12/18   Joy, Shawn C, PA-C  ondansetron (ZOFRAN ODT) 4 MG disintegrating tablet Take 1 tablet (4 mg total) by mouth every 8 (eight) hours as needed for nausea or vomiting. 06/11/15   Joy, Shawn C, PA-C      Allergies    Patient has no known allergies.    Review of Systems   Review of Systems  Physical Exam Updated Vital Signs BP (!) 127/94 (BP Location: Left Arm)   Pulse 96   Temp 99.4 F (37.4 C)   Resp (!) 24   Ht 5\' 8"  (1.727 m)   Wt 81.6 kg   SpO2 96%   BMI 27.35 kg/m  Physical Exam Vitals and nursing note reviewed.  Constitutional:      Appearance: He is well-developed.  HENT:     Head: Normocephalic and atraumatic.     Jaw: No trismus.     Right Ear: Tympanic membrane, ear canal and external ear normal.     Left Ear: Tympanic membrane, ear canal and external ear normal.      Nose: Congestion present. No mucosal edema or rhinorrhea.     Mouth/Throat:     Mouth: Mucous membranes are not dry.     Pharynx: Uvula midline. No oropharyngeal exudate, posterior oropharyngeal erythema or uvula swelling.     Tonsils: No tonsillar abscesses.  Eyes:     General:        Right eye: No discharge.        Left eye: No discharge.     Conjunctiva/sclera: Conjunctivae normal.  Cardiovascular:     Rate and Rhythm: Normal rate and regular rhythm.     Heart sounds: Normal heart sounds.  Pulmonary:     Effort: Pulmonary effort is normal. No respiratory distress.     Breath sounds: Normal breath sounds. No wheezing or rales.  Abdominal:     Palpations: Abdomen is soft.     Tenderness: There is no abdominal tenderness.  Musculoskeletal:     Cervical back: Normal range of motion and neck supple.  Skin:    General: Skin is warm and dry.  Neurological:     Mental Status: He is alert.     ED Results / Procedures / Treatments   Labs (all labs ordered are  listed, but only abnormal results are displayed) Labs Reviewed  RESP PANEL BY RT-PCR (RSV, FLU A&B, COVID)  [id number]    EKG None  Radiology No results found.  Procedures Procedures    Medications Ordered in ED Medications - No data to display  ED Course/ Medical Decision Making/ A&P    Patient seen and examined. History obtained directly from patient.   Labs/EKG: Ordered flu, COVID, RSV.  Imaging: None ordered  Medications/Fluids: None ordered  Most recent vital signs reviewed and are as follows: BP (!) 127/94 (BP Location: Left Arm)   Pulse 96   Temp 99.4 F (37.4 C)   Resp (!) 24   Ht 5\' 8"  (1.727 m)   Wt 81.6 kg   SpO2 96%   BMI 27.35 kg/m   Initial impression: Flu-like illness  10:53 PM Reassessment performed. Patient appears stable.   Labs personally reviewed and interpreted including: Flu positive  Reviewed pertinent lab work and imaging with patient at bedside. Questions answered.    Most current vital signs reviewed and are as follows: BP (!) 127/94 (BP Location: Left Arm)   Pulse 96   Temp 99.4 F (37.4 C)   Resp (!) 24   Ht 5\' 8"  (1.727 m)   Wt 81.6 kg   SpO2 96%   BMI 27.35 kg/m   Plan: Discharge to home.   Prescriptions written for: None  Other home care instructions discussed: NSAIDs, rest, hydration  Patient discharged to home. Encouraged to rest and drink plenty of fluids.  Patient told to return to ED or see their primary doctor if their symptoms worsen, high fever not controlled with tylenol, persistent vomiting, they feel they are dehydrated, or if they have any other concerns.  Patient verbalized understanding and agreed with plan.                                    Medical Decision Making  Patient with symptoms consistent with influenza. Vitals are stable, low-grade fever. No signs of dehydration, tolerating PO's. Lungs are clear. Supportive therapy indicated with return if symptoms worsen. Patient counseled.         Final Clinical Impression(s) / ED Diagnoses Final diagnoses:  Influenza A    Rx / DC Orders ED Discharge Orders     None         Renne Crigler, PA-C 05/20/23 2319    Anders Simmonds T, DO 05/23/23 1510

## 2023-05-20 NOTE — Discharge Instructions (Signed)
Please read and follow all provided instructions.  Your diagnoses today include:  1. Influenza A    Tests performed today include: Flu, COVID, RSV:  Vital signs. See below for your results today.   Medications prescribed:  Please use over-the-counter NSAID medications (ibuprofen, naproxen) or Tylenol (acetaminophen) as directed on the packaging for pain -- as long as you do not have any reasons avoid these medications. Reasons to avoid NSAID medications include: weak kidneys, a history of bleeding in your stomach or gut, or uncontrolled high blood pressure or previous heart attack. Reasons to avoid Tylenol include: liver problems or ongoing alcohol use. Never take more than 4000mg  or 8 Extra strength Tylenol in a 24 hour period.     Take any prescribed medications only as directed.  Home care instructions:  Follow any educational materials contained in this packet. Please continue drinking plenty of fluids. Use over-the-counter cold and flu medications as needed as directed on packaging for symptom relief. You may also use ibuprofen or tylenol as directed on packaging for pain or fever.   BE VERY CAREFUL not to take multiple medicines containing Tylenol (also called acetaminophen). Doing so can lead to an overdose which can damage your liver and cause liver failure and possibly death.   Follow-up instructions: Please follow-up with your primary care provider in the next 3 days for further evaluation of your symptoms.   Return instructions:  Please return to the Emergency Department if you experience worsening symptoms. Please return if you have a high fever greater than 101 degrees not controlled with over-the-counter medications, persistent vomiting and cannot keep down fluids, or worsening trouble breathing. Please return if you have any other emergent concerns.  Additional Information:  Your vital signs today were: BP (!) 127/94 (BP Location: Left Arm)   Pulse 96   Temp 99.4 F  (37.4 C)   Resp (!) 24   Ht 5\' 8"  (1.727 m)   Wt 81.6 kg   SpO2 96%   BMI 27.35 kg/m  If your blood pressure (BP) was elevated above 135/85 this visit, please have this repeated by your doctor within one month.
